# Patient Record
Sex: Male | Born: 1950 | Race: Black or African American | Hispanic: No | Smoking: Never smoker
Health system: Southern US, Community
[De-identification: ages and names within clinical notes are randomized; demographics above are authoritative.]

## PROBLEM LIST (undated history)

## (undated) DIAGNOSIS — F1110 Opioid abuse, uncomplicated: Secondary | ICD-10-CM

## (undated) DIAGNOSIS — B1920 Unspecified viral hepatitis C without hepatic coma: Secondary | ICD-10-CM

## (undated) DIAGNOSIS — Z8619 Personal history of other infectious and parasitic diseases: Secondary | ICD-10-CM

## (undated) HISTORY — DX: Personal history of other infectious and parasitic diseases: Z86.19

## (undated) HISTORY — DX: Unspecified viral hepatitis C without hepatic coma: B19.20

## (undated) HISTORY — PX: TONSILLECTOMY: SUR1361

---

## 1988-11-08 DIAGNOSIS — B1920 Unspecified viral hepatitis C without hepatic coma: Secondary | ICD-10-CM

## 1988-11-08 HISTORY — DX: Unspecified viral hepatitis C without hepatic coma: B19.20

## 2000-05-26 ENCOUNTER — Encounter (INDEPENDENT_AMBULATORY_CARE_PROVIDER_SITE_OTHER): Payer: Self-pay

## 2000-05-26 ENCOUNTER — Ambulatory Visit (HOSPITAL_COMMUNITY): Admission: RE | Admit: 2000-05-26 | Discharge: 2000-05-26 | Payer: Self-pay | Admitting: Gastroenterology

## 2001-11-08 HISTORY — PX: COLONOSCOPY: SHX174

## 2002-08-06 ENCOUNTER — Encounter: Payer: Self-pay | Admitting: Internal Medicine

## 2002-08-06 ENCOUNTER — Ambulatory Visit (HOSPITAL_COMMUNITY): Admission: RE | Admit: 2002-08-06 | Discharge: 2002-08-06 | Payer: Self-pay | Admitting: Internal Medicine

## 2004-11-08 HISTORY — PX: PERCUTANEOUS LIVER BIOPSY: SUR136

## 2004-12-23 ENCOUNTER — Ambulatory Visit: Payer: Self-pay | Admitting: Gastroenterology

## 2005-01-18 ENCOUNTER — Ambulatory Visit: Payer: Self-pay | Admitting: Gastroenterology

## 2005-09-06 ENCOUNTER — Ambulatory Visit: Payer: Self-pay | Admitting: Internal Medicine

## 2005-09-08 ENCOUNTER — Ambulatory Visit: Payer: Self-pay | Admitting: Internal Medicine

## 2009-02-03 ENCOUNTER — Encounter: Payer: Self-pay | Admitting: Internal Medicine

## 2009-03-10 ENCOUNTER — Encounter: Payer: Self-pay | Admitting: Internal Medicine

## 2009-04-15 ENCOUNTER — Ambulatory Visit: Payer: Self-pay | Admitting: Internal Medicine

## 2009-04-15 DIAGNOSIS — Z8619 Personal history of other infectious and parasitic diseases: Secondary | ICD-10-CM

## 2009-04-15 DIAGNOSIS — L0390 Cellulitis, unspecified: Secondary | ICD-10-CM

## 2009-04-15 DIAGNOSIS — L0291 Cutaneous abscess, unspecified: Secondary | ICD-10-CM

## 2009-04-15 HISTORY — DX: Personal history of other infectious and parasitic diseases: Z86.19

## 2009-04-16 ENCOUNTER — Encounter: Payer: Self-pay | Admitting: Internal Medicine

## 2009-04-17 ENCOUNTER — Encounter: Payer: Self-pay | Admitting: Internal Medicine

## 2009-04-17 ENCOUNTER — Inpatient Hospital Stay (HOSPITAL_COMMUNITY): Admission: EM | Admit: 2009-04-17 | Discharge: 2009-04-19 | Payer: Self-pay | Admitting: Emergency Medicine

## 2009-04-18 ENCOUNTER — Encounter (INDEPENDENT_AMBULATORY_CARE_PROVIDER_SITE_OTHER): Payer: Self-pay | Admitting: General Surgery

## 2009-04-23 ENCOUNTER — Telehealth (INDEPENDENT_AMBULATORY_CARE_PROVIDER_SITE_OTHER): Payer: Self-pay | Admitting: *Deleted

## 2011-02-15 LAB — BASIC METABOLIC PANEL
BUN: 19 mg/dL (ref 6–23)
CO2: 26 mEq/L (ref 19–32)
Calcium: 9.8 mg/dL (ref 8.4–10.5)
Chloride: 106 mEq/L (ref 96–112)
Creatinine, Ser: 1.15 mg/dL (ref 0.4–1.5)
GFR calc Af Amer: >60 mL/min (ref >60)
GFR calc non Af Amer: >60 mL/min (ref >60)
Glucose, Bld: 86 mg/dL (ref 70–99)
Potassium: 4.3 mEq/L (ref 3.5–5.1)
Sodium: 137 mEq/L (ref 135–145)

## 2011-02-15 LAB — TISSUE CULTURE

## 2011-02-15 LAB — DIFFERENTIAL
Basophils Absolute: 0 10*3/uL (ref 0.0–0.1)
Basophils Relative: 0 % (ref 0–1)
Eosinophils Absolute: 0.2 10*3/uL (ref 0.0–0.7)
Eosinophils Relative: 3 % (ref 0–5)
Lymphocytes Relative: 22 % (ref 12–46)
Lymphs Abs: 1.2 10*3/uL (ref 0.7–4.0)
Monocytes Absolute: 1 10*3/uL (ref 0.1–1.0)
Monocytes Relative: 17 % — ABNORMAL HIGH (ref 3–12)
Neutro Abs: 3.3 10*3/uL (ref 1.7–7.7)
Neutrophils Relative %: 58 % (ref 43–77)

## 2011-02-15 LAB — CBC
HCT: 41 % (ref 39.0–52.0)
Hemoglobin: 13.8 g/dL (ref 13.0–17.0)
MCHC: 33.7 g/dL (ref 30.0–36.0)
MCV: 87.5 fL (ref 78.0–100.0)
Platelets: 333 10*3/uL (ref 150–400)
RBC: 4.68 MIL/uL (ref 4.22–5.81)
RDW: 13.8 % (ref 11.5–15.5)
WBC: 5.7 10*3/uL (ref 4.0–10.5)

## 2011-02-15 LAB — ANAEROBIC CULTURE

## 2011-03-04 ENCOUNTER — Encounter: Payer: Self-pay | Admitting: Internal Medicine

## 2011-03-08 ENCOUNTER — Ambulatory Visit: Payer: Self-pay | Admitting: Internal Medicine

## 2011-03-08 DIAGNOSIS — Z0289 Encounter for other administrative examinations: Secondary | ICD-10-CM

## 2011-03-19 ENCOUNTER — Emergency Department (HOSPITAL_BASED_OUTPATIENT_CLINIC_OR_DEPARTMENT_OTHER)
Admission: EM | Admit: 2011-03-19 | Discharge: 2011-03-19 | Disposition: A | Discharge status: Home / Self Care | Payer: Self-pay | Attending: Emergency Medicine | Admitting: Emergency Medicine

## 2011-03-19 ENCOUNTER — Emergency Department (INDEPENDENT_AMBULATORY_CARE_PROVIDER_SITE_OTHER): Payer: Self-pay

## 2011-03-19 DIAGNOSIS — M25469 Effusion, unspecified knee: Secondary | ICD-10-CM | POA: Insufficient documentation

## 2011-03-19 DIAGNOSIS — M25569 Pain in unspecified knee: Secondary | ICD-10-CM

## 2011-03-19 LAB — SYNOVIAL CELL COUNT + DIFF, W/ CRYSTALS
Crystals, Fluid: NONE SEEN
Eosinophils-Synovial: 0 % (ref 0–1)
Lymphocytes-Synovial Fld: 26 % — ABNORMAL HIGH (ref 0–20)
Monocyte-Macrophage-Synovial Fluid: 47 % — ABNORMAL LOW (ref 50–90)
Neutrophil, Synovial: 27 % — ABNORMAL HIGH (ref 0–25)
Other Cells-SYN: 0
WBC, Synovial: 44 /mm3 (ref 0–200)

## 2011-03-19 LAB — GRAM STAIN

## 2011-03-22 LAB — PATHOLOGIST SMEAR REVIEW

## 2011-03-23 LAB — BODY FLUID CULTURE: Culture: NO GROWTH

## 2011-03-23 NOTE — Op Note (Signed)
NAME:  [name], [name]              ACCOUNT NO.:  [id number]   MEDICAL RECORD NO.:  [id number]          PATIENT TYPE:  INP   LOCATION:  5127                         FACILITY:  [facility]   PHYSICIAN:  Almond Lint, MD       DATE OF BIRTH:  05-25-1951   DATE OF PROCEDURE:  DATE OF DISCHARGE:                               OPERATIVE REPORT   PREOPERATIVE DIAGNOSIS:  Bilateral chronic buttock abscess.   POSTOPERATIVE DIAGNOSIS:  Bilateral chronic buttock abscess.   PROCEDURE PERFORMED:  Irrigation and debridement of bilateral chronic  buttock abscesses.   SURGEON:  Almond Lint, MD   ANESTHESIA:  General and local.   FINDINGS:  Chronic nonhealing wounds with minimal pus.  No fistulous  tract found to the rectum.  No anorectal masses palpated or seen on  anoscopy.   SPECIMEN:  Debrided tissue and cultures sent to micro and pathology.   ESTIMATED BLOOD LOSS:  Minimal.   COMPLICATIONS:  None known.   PROCEDURE:  [name] is a 60 year old male who was identified in the  holding area and taken to the operating room where he was intubated on  the stretcher and flipped to the prone position.  His perianal region  was prepped and draped in a sterile fashion.  The digital exam was  performed of the anus with no palpable masses or chronic sinus tracts  palpable.  The anoscope was then advanced into the anus and there was no  inflammation or sequelae of either hemorrhoid or fistulas, or fissures.  Attention was then directed to the right wound.  This was larger of the  2 wounds and the chronic abscess cavity was cleaned out back to bleeding  tissue with the scalpel and curved Mayo scissors.  Cultures were taken  of this for anaerobes and aerobes.  Also portion was sent for pathology.  The area was copiously irrigated.  Attention was then directed to the  left side.  This had not quite come to the head yet, but does appear to  be slightly fluctuant.  This was incised and a small amount of pus  was  obtained.  This was cultured.  The indurated area was then taken out  with combination of this scalpel and curve Mayo.  This was also debrided  back to healthy bleeding tissue.  This area was made hemostatic with the  Bovie.  They were then packed with 1-inch half-inch iodoform gauze.  The  patient was awakened and then taken to PACU in stable condition.      Almond Lint, MD  Electronically Signed    FB/MEDQ  D:  04/18/2009  T:  04/19/2009  Job:  [id number]

## 2011-03-23 NOTE — H&P (Signed)
NAME:  RENDERElder, Noah              ACCOUNT NO.:  [id number]   MEDICAL RECORD NO.:  [id number]          PATIENT TYPE:  INP   LOCATION:  5127                         FACILITY:  [facility]   PHYSICIAN:  Lennie Muckle, MD      DATE OF BIRTH:  [birth year]   DATE OF ADMISSION:  04/17/2009  DATE OF DISCHARGE:                              HISTORY & PHYSICAL   Office chart number is [phone].   Noah [name] is a 60 year old male who has approximately a 2-year history  on and off abscesses on his buttock area.  He states that last week it  became increase in drainage and painful.  He was seen by Dr. Marga Melnick who debrided the wound and placed him on Keflex.  Due to the  increasing drainage, he was sent to our Surgery Clinic for followup.  He  states he has no fevers or chills but is bothered by the drainage.  He  has hepatitis B.  He states he has been tested for HIV, but he has not  had the results of these.  He has had no illnesses that he is aware of.  He does have a small wound on his left anterior shin.   PAST MEDICAL HISTORY:  Significant for hepatitis B.   PAST SURGICAL HISTORY:  Liver biopsy.   MEDICATIONS:  TMP, sulfamethizole 800/160 for antibiotics.   FAMILY HISTORY:  His father had cirrhosis, mother had diabetes.   SOCIAL HISTORY:  He has been self-employed.  He is a Media planner, has 2  children, and is divorced.  He does not smoke.  He does drink every  weekend approximately a pint.   REVIEW OF SYSTEMS:  He has hepatitis B, otherwise is negative.   PHYSICAL EXAMINATION:  GENERAL:  He is a well-developed, well-nourished  male, in no acute distress.  VITAL SIGNS:  Blood pressure 131/73, pulse 78, temperature 97.8, weight  170.  HEENT:  Head is normocephalic.  Wears corrective lenses.  Sclerae are  clear.  NECK:  Supple.  No lymphadenopathy.  CHEST:  Clear to auscultation bilaterally.  CARDIOVASCULAR:  Regular rate and rhythm.  ABDOMEN:  Soft, nontender, nondistended.  SKIN:  There is a small wound to the left anterior shin.  His buttock  area, there is approximately a 3-cm wound on the right buttock cheek,  which is approximately 2-3 cm deep.  There is some exudative material.  Chronic inflammatory reaction on the left buttock cheek.  There is a  small 1-cm papule-like area on the left side of cheek.  This is not  draining.  There does not appear to be any connection with the rectum.   ASSESSMENT AND PLAN:  Acute on chronic abscess cavity in the buttock  area.  I think this needs debridement in the operating room, more  extensive than what it can be done in the office.  I have contacted Dr.  Donell Beers for  debridement of the wound tomorrow.  We will get preoperative testing on  him with a CBC, serum chemistries, place him on IV antibiotic tonight of  Invanz and debridement  tomorrow and teach him wound care and then  hopefully home in the next day or so.  All questions were answered and  now we will go ahead and send him to [facility] today.      Lennie Muckle, MD  Electronically Signed     ALA/MEDQ  D:  04/17/2009  T:  04/18/2009  Job:  [id number]   cc:   Titus Dubin. Alwyn Ren, MD,FACP,FCCP

## 2011-03-26 NOTE — Procedures (Signed)
[facility]  Patient:    RENDERSelig, [place]                       MRN: [id number] Proc. Date: 05/26/00 Adm. Date:  [id number] Disc. Date: [id number] Attending:  Judeth Cornfield                           Procedure Report  PROCEDURE:  Percutaneous liver biopsy.  GASTROENTEROLOGIST:  Barbette Hair. Arlyce Dice, M.D. [facility]  HISTORY:  The patient has hepatitis C and abnormal liver tests.  INFORMED CONSENT:  The patient provided consent after risks, benefits, and alternatives were explained.  MEDICATIONS:  Versed 2.5 mg, fentanyl 37.5 mcg IV.  DESCRIPTION OF PROCEDURE:  The liver was percussed.  In the supine position, the skin was prepped with Betadine and injected with 1% lidocaine. Percutaneous biopsy was made via one pass utilizing a 14-gauge tissue biopsy needle.  A 1 cm liver core was obtained.  The patient tolerated the procedure well. DD:  08/09/00 TD:  08/09/00 Job: [id number]

## 2011-03-26 NOTE — Discharge Summary (Signed)
NAME:  [name], Noah [name]              ACCOUNT NO.:  [id number]   MEDICAL RECORD NO.:  [id number]          PATIENT TYPE:  INP   LOCATION:  5127                         FACILITY:  [facility]   PHYSICIAN:  Currie Paris, M.D.DATE OF BIRTH:  [birth year]   DATE OF ADMISSION:  04/17/2009  DATE OF DISCHARGE:  04/19/2009                               DISCHARGE SUMMARY   ADMITTING PHYSICIAN:  Amber L. Freida Busman, MD   OPERATIVE PHYSICIAN:  Almond Lint, MD   CHIEF COMPLAINT/REASON FOR ADMISSION:  [name] is a 60 year old male  patient who has on and off again history of buttock abscesses over the  past 2 years.  Over the last week, it has increased in drainage and  become more painful.  He was seen by Dr. Alwyn Ren, primary care physician  who placed him on Keflex.  Due to increasing drainage, he was sent to  the Urgent Clinic at [facility] for possible drainage of  this area.  He has no fevers or chills and was concerned about the  drainage.  He has known hepatitis B and has been tested recently for HIV  that does not have the results of this back.  On exam, he was found to  have a small wound on the left anterior shin.  In the buttock area,  there was a 3-cm wound of right buttock cheek up to 3 cm deep with  exudate material, chronic inflammatory reaction at the buttock, cheek,  and a tiny 1-cm papula carrying the left side of the cheek, not  draining.  There does not appear to be any connection with the rectum  based on clinical exam.  The patient was admitted with the following  diagnoses; acute on chronic abscess cavity in the buttock area requiring  intraoperative debris.   HOSPITAL COURSE:  The patient was admitted by Dr. Freida Busman where he was  subsequently taken to the OR by Dr. Donell Beers where he underwent I and D of  the right buttock wound.  He was also had the area on the left buttock  drained as well.  He have been placed on empiric antibiotics therapy  with Pincus Sanes and the  surgical date was April 18, 2009.   By postop day #1, April 19, 2009, the patient was evaluated by Dr. Jamey Ripa  who felt that the patient was ready for discharge home.  Wounds were  stable.  He will be having [facility] following  and assisting with b.i.d. wound care.   DISCHARGE DIAGNOSIS:  Acute on chronic bilateral buttock abscesses,  status post intraoperative incision and drainage.   DISCHARGE MEDICATIONS:  1. Sulfamethazine - TMP 800/160 b.i.d., continue previous      prescription.  2. Vicodin one to two tablets every 4 hours as needed for pain.   OTHER INSTRUCTIONS:  No restrictions in diet.  No restrictions in  activity.  Recommend not driving while taking Vicodin.  Wound care per  home health RN and normal saline packing b.i.d.  Shower and sitz baths  one to four times daily.   FOLLOWUP APPOINTMENTS:  He is  to call Dr. Arita Miss to be seen in 10-14  days.      Allison L. Rennis Harding, N.P.      Currie Paris, M.D.  Electronically Signed    ALE/MEDQ  D:  05/06/2009  T:  05/07/2009  Job:  [id number]   cc:   Almond Lint, MD

## 2011-05-14 ENCOUNTER — Encounter: Payer: Self-pay | Admitting: Internal Medicine

## 2011-05-17 ENCOUNTER — Encounter: Payer: Self-pay | Admitting: Internal Medicine

## 2011-05-17 ENCOUNTER — Ambulatory Visit (INDEPENDENT_AMBULATORY_CARE_PROVIDER_SITE_OTHER): Payer: Self-pay | Admitting: Internal Medicine

## 2011-05-17 VITALS — BP 126/80 | HR 64 | Temp 98.2°F | Resp 14 | Ht 68.75 in | Wt 179.8 lb

## 2011-05-17 DIAGNOSIS — K219 Gastro-esophageal reflux disease without esophagitis: Secondary | ICD-10-CM

## 2011-05-17 DIAGNOSIS — Z0000 Encounter for general adult medical examination without abnormal findings: Secondary | ICD-10-CM

## 2011-05-17 DIAGNOSIS — M25461 Effusion, right knee: Secondary | ICD-10-CM

## 2011-05-17 DIAGNOSIS — M25469 Effusion, unspecified knee: Secondary | ICD-10-CM

## 2011-05-17 DIAGNOSIS — Z23 Encounter for immunization: Secondary | ICD-10-CM

## 2011-05-17 DIAGNOSIS — B1710 Acute hepatitis C without hepatic coma: Secondary | ICD-10-CM

## 2011-05-17 DIAGNOSIS — R9431 Abnormal electrocardiogram [ECG] [EKG]: Secondary | ICD-10-CM

## 2011-05-17 LAB — POCT URINALYSIS DIPSTICK
Bilirubin, UA: NEGATIVE
Glucose, UA: NEGATIVE
Ketones, UA: NEGATIVE
Spec Grav, UA: 1.015

## 2011-05-17 MED ORDER — TRAMADOL HCL 50 MG PO TABS: 50.0000 mg | ORAL_TABLET | Freq: Four times a day (QID) | ORAL | Status: DC | PRN

## 2011-05-17 MED ORDER — TETANUS-DIPHTH-ACELL PERTUSSIS 5-2.5-18.5 LF-MCG/0.5 IM SUSP
0.5000 mL | Freq: Once | INTRAMUSCULAR | Status: AC
  Administered 2011-05-17: 0.5 mL via INTRAMUSCULAR

## 2011-05-17 MED ORDER — RANITIDINE HCL 150 MG PO TABS: 150.0000 mg | ORAL_TABLET | Freq: Two times a day (BID) | ORAL | Status: DC

## 2011-05-17 NOTE — Patient Instructions (Signed)
Please consider a cardiology referral to assess the nonspecific T wave changes on EKG which we discussed. The triggers for dyspepsia or "heart burn"  include stress; the "aspirin family" ; alcohol; peppermint; and caffeine (coffee, tea, cola, and chocolate). The aspirin family would include aspirin and the nonsteroidal agents such as ibuprofen &  Naproxen. Tylenol would not cause reflux. If having dyspepsia ; food & dink should be avoided for @ least 2 hors before going to bed.

## 2011-05-17 NOTE — Progress Notes (Signed)
Subjective:    Patient ID: Noah [name], male    DOB: 07/07/1951, 60 y.o.   MRN: [id number]  HPI Mr. Zane Herald is here for a physical as recommended by the  [facility] at [facility]  60. He had had  pain and swelling in the right knee for over a month which he treated with vinegar ingestion w/o response He denied an injury or trauma.  On 5/11 the knee was tapped. The films had  revealed the effusion with no bony changes.  Gram stain revealed rare white cells with  PMNs and mononuclear cells. There were no organisms seen. Microscopic analysis revealed no crystals.  The fluid was returned the day after it was drained. He continues to have pain and swelling in the knee.  He was to be seen by  The Sports Medicine specialist, Dr. Pearletha Forge after a comprehensive evaluation.    Review of Systems Patient reports no vision changes,anorexia, weight change, fever ,adenopathy, persistant / recurrent hoarseness, swallowing issues, chest pain,palpitations, edema,persistant / recurrent cough, hemoptysis, dyspnea(rest, exertional, paroxysmal nocturnal), gastrointestinal  bleeding (melena, rectal bleeding), abdominal pain,  GU symptoms( dysuria, hematuria, pyuria, voiding/incontinence  issues) syncope, focal weakness, memory loss,numbness & tingling, skin/hair/nail changes,depression, anxiety, or abnormal bruising/bleeding. He describes intermittent substernal burning upon awakening. Has also had dyspepsia without dysphagia. 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 This was evaluated with an MRI was normal.    Objective:   Physical Exam Gen.: Healthy and well-nourished in appearance. Alert, appropriate and cooperative throughout exam. Head: Normocephalic without obvious abnormalities;  no alopecia . Moustache Eyes: No corneal or conjunctival inflammation noted. Pupils equal round reactive to light and accommodation. Fundal exam is benign without hemorrhages, exudate,  papilledema. Extraocular motion intact. Vision grossly normal with lenses. Arcus senilis . Ears: External  ear exam reveals no significant lesions or deformities. Wax R canal . Hearing is grossly reduced on R Nose: External nasal exam reveals no deformity or inflammation. Nasal mucosa are pink and moist. No lesions or exudates noted.  Mouth: Oral mucosa and oropharynx reveal no lesions or exudates. Teeth in good repair. Neck: No deformities, masses, or tenderness noted. Range of motion &. Thyroid normal. Lungs: Normal respiratory effort; chest expands symmetrically. Lungs are clear to auscultation without rales, wheezes, or increased work of breathing. Heart: Normal rate and rhythm. Normal S1 and S2. No gallop, click, or rub. Grade 1/2 - 1 systolic murmur . Abdomen: Bowel sounds normal; abdomen soft and nontender. No masses, organomegaly or hernias noted. Genitalia/ DRE : BPH w/o nodules   .                                                                                   Musculoskeletal/extremities: No deformity or scoliosis noted of  the thoracic or lumbar spine. No clubbing, cyanosis, edema, or deformity noted. Range of motion  normal .Tone & strength  normal.Joints normal. Nail health  Good. Ecchymosis 4th  L fingernail. Effusion R knee Vascular: Carotid, radial artery, dorsalis pedis and  posterior tibial pulses are full and equal. No bruits present. Neurologic: Alert and oriented x3. Deep tendon reflexes symmetrical and  normal.          Skin: Intact without suspicious lesions or rashes. 2X 1.5 keratosis R thigh. Lymph: No cervical, axillary, or inguinal lymphadenopathy present. Psych: Mood and affect are normal. Normally interactive                                                                                         Assessment & Plan:  #1 comprehensive physical exam;acute issue is R knee effusion #2 see Problem List with Assessments & Recommendations  #3 nonspecific EKG  abnormalities; possible slight progression in the lateral V. leads since 2006.  #4 esophageal reflux  Plan: He will be referred to Dr. Pearletha Forge to address the knee or effusion. Pain medications will be provided pending that appointment.  Because of the nonspecific EKG changes and the chest discomfort, further cardiac evaluation would be recommended. Ranitidine will be prescribed for reflux which is most likely Plan: see Orders

## 2011-05-17 NOTE — Progress Notes (Signed)
Addended by: Edgardo Roys on: 05/17/2011 03:49 PM   Modules accepted: Orders

## 2011-05-18 ENCOUNTER — Telehealth: Payer: Self-pay

## 2011-05-18 LAB — CBC WITH DIFFERENTIAL/PLATELET
Basophils Absolute: 0 10*3/uL (ref 0.0–0.1)
Eosinophils Relative: 1.5 % (ref 0.0–5.0)
Hemoglobin: 14.2 g/dL (ref 13.0–17.0)
Lymphocytes Relative: 29.4 % (ref 12.0–46.0)
Monocytes Relative: 11.6 % (ref 3.0–12.0)
Platelets: 275 10*3/uL (ref 150.0–400.0)
RDW: 14 % (ref 11.5–14.6)
WBC: 5.9 10*3/uL (ref 4.5–10.5)

## 2011-05-18 LAB — BASIC METABOLIC PANEL
BUN: 13 mg/dL (ref 6–23)
GFR: 93.54 mL/min (ref >60.00)
Potassium: 4.6 mEq/L (ref 3.5–5.1)
Sodium: 140 mEq/L (ref 135–145)

## 2011-05-18 LAB — LIPID PANEL
Cholesterol: 170 mg/dL (ref 0–200)
LDL Cholesterol: 94 mg/dL (ref 0–99)
Total CHOL/HDL Ratio: 3

## 2011-05-18 LAB — HEPATIC FUNCTION PANEL
ALT: 14 U/L (ref 0–53)
AST: 23 U/L (ref 0–37)
Alkaline Phosphatase: 42 U/L (ref 39–117)
Total Bilirubin: 1.4 mg/dL — ABNORMAL HIGH (ref 0.3–1.2)

## 2011-05-18 NOTE — Telephone Encounter (Signed)
Left msg on voicemail.

## 2011-05-18 NOTE — Telephone Encounter (Signed)
Patient called to ask why Dr.Hopper gave him rx for Tramadol vs Vicodin. Patient said he asked for rx for Vicodin because that's what he had in the past and it worked great.  Dr.Hopper please advise

## 2011-05-18 NOTE — Telephone Encounter (Signed)
I called patient back with Dr.Hopper's response and he re-stated that he is most comfortable with Vicodin and he will be the one taking the med and fells that he should get what he is most comfortable with and he would like to talk ONLY with Dr.Hopper. Patient requested that Dr.Hopper call him back

## 2011-05-18 NOTE — Telephone Encounter (Signed)
Give him 30 Vicodin, 1 every 6 hrs prn  & get him into Dr Pearletha Forge @ [address]  ASAP

## 2011-05-18 NOTE — Telephone Encounter (Signed)
The tramadol is in the same family and does not have as severe adverse effects as the Vicodin does. A major issue with Vicodin is  constipation which can be debilitating. The tramadol as much easier on the GI tract.

## 2011-05-19 MED ORDER — HYDROCODONE-ACETAMINOPHEN 5-500 MG PO TABS: 1.0000 | ORAL_TABLET | Freq: Four times a day (QID) | ORAL | Status: DC | PRN

## 2011-05-19 NOTE — Telephone Encounter (Signed)
Spoke w/ pt aware of information.  

## 2011-05-24 ENCOUNTER — Ambulatory Visit (INDEPENDENT_AMBULATORY_CARE_PROVIDER_SITE_OTHER): Payer: Self-pay | Admitting: Family Medicine

## 2011-05-24 VITALS — BP 120/76 | HR 69 | Ht 69.5 in | Wt 170.2 lb

## 2011-05-24 DIAGNOSIS — M25569 Pain in unspecified knee: Secondary | ICD-10-CM

## 2011-05-24 DIAGNOSIS — M25561 Pain in right knee: Secondary | ICD-10-CM

## 2011-05-24 NOTE — Patient Instructions (Signed)
Your pain is either from arthritis or a degenerative meniscal tear of your knee. These are both treated similarly to start with. Take tylenol 500mg  1-2 tabs three times a day for pain. Aleve 1-2 tabs twice a day with food Glucosamine sulfate 750mg  twice a day is a supplement that has been shown to help arthritis. Capsaicin topically up to four times a day may also help with pain. Cortisone injections are an option. It's important that you continue to stay active. Consider physical therapy to strengthen muscles around the joint that hurts to take pressure off of the joint itself. Heat or ice 15 minutes at a time 3-4 times a day as needed to help with pain. Water aerobics and cycling with low resistance are the best two types of exercise for arthritis. Follow up with me in 6 weeks for a recheck - if you are not improving as I expect, we can consider MRI to further assess for a meniscal tear.

## 2011-05-25 ENCOUNTER — Encounter: Payer: Self-pay | Admitting: Family Medicine

## 2011-05-25 DIAGNOSIS — M1711 Unilateral primary osteoarthritis, right knee: Secondary | ICD-10-CM | POA: Insufficient documentation

## 2011-05-25 NOTE — Progress Notes (Signed)
Subjective:    Patient ID: Noah [name], male    DOB: 1951-05-18, 60 y.o.   MRN: [id number]  PCP: Marga Melnick MD  HPI 60 yo M here for right knee swelling.  Patient reports for the past 3 months he has had slowly worsening right knee swelling and anterior pain. No known injury and no remote injury. Just woke up and knee was swollen one morning. Has tried hydrocodone, ibuprofen, and knee sleeve. Feels unstable but has not given out. No true catching or locking. Has not tried ice or heat. On 5/11 went to ED and had effusion aspirated - no crystals, only 44 WBCs, no growth on culture. X-rays showed mild DJD. No prior knee surgeries.  Past Medical History  Diagnosis Date  . Hepatitis C 1990    [facility]    Current Outpatient Prescriptions on File Prior to Visit  Medication Sig Dispense Refill  . HYDROcodone-acetaminophen (VICODIN) 5-500 MG per tablet Take 1 tablet by mouth every 6 (six) hours as needed. 1-2 by mouth every 6 hours as needed  30 tablet  0  . mupirocin (BACTROBAN) 2 % cream Apply 1 application topically 2 (two) times daily.        . ranitidine (ZANTAC) 150 MG tablet Take 1 tablet (150 mg total) by mouth 2 (two) times daily.  60 tablet  1  . traMADol (ULTRAM) 50 MG tablet Take 1 tablet (50 mg total) by mouth every 6 (six) hours as needed for pain.  2300 tablet  0    Past Surgical History  Procedure Date  . Colonoscopy 2003  . Percutaneous liver biopsy 2006    Dr Arlyce Dice  . Tonsillectomy     No Known Allergies  History   Social History  . Marital Status: Divorced    Spouse Name: N/A    Number of Children: N/A  . Years of Education: N/A   Occupational History  . Not on file.   Social History Main Topics  . Smoking status: Never Smoker   . Smokeless tobacco: Not on file  . Alcohol Use: Yes  . Drug Use: No  . Sexually Active: Not on file   Other Topics Concern  . Not on file   Social History Narrative  . No narrative on file    Family History   Problem Relation Age of Onset  . Peripheral vascular disease Mother   . Alcohol abuse Father     BP 120/76  Pulse 69  Ht 5' 9.5" (1.765 m)  Wt 170 lb 3.2 oz (77.202 kg)  BMI 24.77 kg/m2  Review of Systems See HPI above.    Objective:   Physical Exam Gen: NAD  R Knee: Mild effusion.  No gross deformity, ecchymoses, warmth. Mild-mod medial joint line TTP.  No lateral joint line, post patellar, pes, or other TTP about knee. FROM. Negative ant/post drawers. Negative valgus/varus testing. Negative lachmanns. Negative mcmurrays, apleys, patellar apprehension, clarkes. NV intact distally.    L knee: FROM without pain, swelling, weakness, or instability.  Assessment & Plan:  1. R knee pain/effusion - based on exam, x-rays, history, prior aspiration results, believe his pain and swelling is most likely related to DJD or possible degenerative medial meniscal tear.  Will start PT.  Given intraarticular cortisone injection today.  Icing, tylenol or nsaids (discussed that he should run tylenol by his hepatitis physician before doing this).  F/u in 6 weeks for reevaluation.  If not improved will consider MRI with possible arthroscopy if shown  to have a meniscal tear.  After informed written consent, patient was seated on exam table. Right knee was prepped with alcohol swab and utilizing anterolateral approach, patient's right knee was injected intraarticularly with 3:1 marcaine: depomedrol. Patient tolerated the procedure well without immediate complications.

## 2011-05-25 NOTE — Assessment & Plan Note (Addendum)
based on exam, x-rays, history, prior aspiration results, believe his pain and swelling is most likely related to DJD or possible degenerative medial meniscal tear.  Will start PT.  Given intraarticular cortisone injection today.  Icing, tylenol or nsaids (discussed that he should run tylenol by his hepatitis physician before doing this).  F/u in 6 weeks for reevaluation.  If not improved will consider MRI with possible arthroscopy if shown to have a meniscal tear.  After informed written consent, patient was seated on exam table. Right knee was prepped with alcohol swab and utilizing anterolateral approach, patient's right knee was injected intraarticularly with 3:1 marcaine: depomedrol. Patient tolerated the procedure well without immediate complications.

## 2011-05-26 ENCOUNTER — Encounter: Payer: Self-pay | Admitting: Internal Medicine

## 2011-05-31 ENCOUNTER — Other Ambulatory Visit: Payer: Self-pay | Admitting: *Deleted

## 2011-06-01 NOTE — Telephone Encounter (Signed)
Filled on 05/19/11- #30, Dr.Hopper please advise

## 2011-06-01 NOTE — Telephone Encounter (Signed)
Any additional pain medications for the knee should come from Dr. Pearletha Forge, the  Specialist he is seeing

## 2011-06-02 ENCOUNTER — Telehealth: Payer: Self-pay | Admitting: Internal Medicine

## 2011-06-02 NOTE — Telephone Encounter (Signed)
I spoke with patient earlier and informed him of Dr.Hopper's response and he indicated he will contact the specialist

## 2011-06-09 ENCOUNTER — Other Ambulatory Visit: Payer: Self-pay | Admitting: *Deleted

## 2011-06-09 ENCOUNTER — Telehealth: Payer: Self-pay | Admitting: Family Medicine

## 2011-06-09 DIAGNOSIS — M25569 Pain in unspecified knee: Secondary | ICD-10-CM

## 2011-06-09 NOTE — Telephone Encounter (Signed)
I sent phone note to Dr Pearletha Forge requesting he address the knee pain issue. If he does not feel narcotic is indicated ;only other option is Orthopedic assessment

## 2011-06-09 NOTE — Telephone Encounter (Signed)
Pt saw Sports Med Specialist-- Dr.Hudnall, he recommended therapist only, he is getting Therapy done at the [facility] (due to insurance). He got a cortisone shot. He states the pain is to bad to do therapy at this point. Sports Med doc will not fill his pain medication. Please advise if he can have a refill on his Hydrocodone .   [facility].

## 2011-06-09 NOTE — Telephone Encounter (Signed)
The specialist should be seen if this pain is persisting or progressing, especially if he can't do PT

## 2011-06-09 NOTE — Telephone Encounter (Signed)
Patient has called our office requesting pain medicine past few business days for his knee pain.  Messages received and were answered by myself and our staff.  Patient was given cortisone injection at last office visit 2 weeks ago for arthritis vs degenerative meniscal tear.  Has had knee aspirated before and this was negative (no gout, infection, etc) consistent with these diagnoses.  He does not have insurance so was given information to contact [employer] to get 100% [employer] coverage so he could start physical therapy and was to call me back when this went through so we could put in the order for this.  This coverage will also be helpful should we proceed with an MRI to further assess.  Just spoke with patient and he has not started this process (though reports he is doing some therapy at the [facility]) - was given [employer] number again.  He can try a different anti-inflammatory along with tylenol and his therapy.  We can repeat cortisone injections every 3 months if needed (though as this one has not helped him per his report, would move forward with MRI next).  We told him each phone call that we do not fill/refill narcotic pain medicine for arthritis/degenerative meniscal tear of the knee.

## 2011-06-09 NOTE — Telephone Encounter (Signed)
Message left for patient to return my call.  

## 2011-06-09 NOTE — Telephone Encounter (Signed)
Called pt, advised him of Dr.Hoppers advice. Pt states that the specialist is telling him he has to see Dr.Hopper to get any medication. Advised pt that he could see Dr.Hopper but that Dr.Hopper referred him to a specialist so that they could treat him and there would be no guarantee that Dr.Hopper would fill this medication for him today. Advised pt to call Sports med and tell them that his pain is getting worse and his unable to do therapy due to pain. Pt would not say one way or another what he was going to do. Did not make an appt here either for today.

## 2011-06-10 NOTE — Telephone Encounter (Signed)
Pt is aware. He would like ortho referral.

## 2011-06-10 NOTE — Telephone Encounter (Signed)
Message left for patient to return my call.  

## 2011-06-18 ENCOUNTER — Other Ambulatory Visit: Payer: Self-pay | Admitting: Internal Medicine

## 2011-06-18 NOTE — Telephone Encounter (Signed)
Dr.Hopper please further advise on refill request

## 2011-06-18 NOTE — Telephone Encounter (Signed)
Left detailed msg on pharmacy voicemail rx denied.

## 2011-06-18 NOTE — Telephone Encounter (Signed)
This medication must come from the sports medicine specialist who is treating his knee. If he  continuse to have pain he should see that specialist again or  I can refer him to an orthopedist.

## 2011-07-04 ENCOUNTER — Emergency Department (HOSPITAL_BASED_OUTPATIENT_CLINIC_OR_DEPARTMENT_OTHER)
Admission: EM | Admit: 2011-07-04 | Discharge: 2011-07-04 | Disposition: A | Discharge status: Home / Self Care | Payer: Self-pay | Attending: Emergency Medicine | Admitting: Emergency Medicine

## 2011-07-04 ENCOUNTER — Encounter (HOSPITAL_BASED_OUTPATIENT_CLINIC_OR_DEPARTMENT_OTHER): Payer: Self-pay | Admitting: *Deleted

## 2011-07-04 ENCOUNTER — Emergency Department (INDEPENDENT_AMBULATORY_CARE_PROVIDER_SITE_OTHER): Payer: Self-pay

## 2011-07-04 DIAGNOSIS — M79609 Pain in unspecified limb: Secondary | ICD-10-CM

## 2011-07-04 DIAGNOSIS — R609 Edema, unspecified: Secondary | ICD-10-CM

## 2011-07-04 DIAGNOSIS — M25449 Effusion, unspecified hand: Secondary | ICD-10-CM

## 2011-07-04 DIAGNOSIS — M7989 Other specified soft tissue disorders: Secondary | ICD-10-CM | POA: Insufficient documentation

## 2011-07-04 DIAGNOSIS — W19XXXA Unspecified fall, initial encounter: Secondary | ICD-10-CM | POA: Insufficient documentation

## 2011-07-04 MED ORDER — HYDROCODONE-ACETAMINOPHEN 5-325 MG PO TABS: 2.0000 | ORAL_TABLET | ORAL | Status: DC | PRN

## 2011-07-04 NOTE — ED Notes (Signed)
Pt fell on two weeks ago and injured his left middle finger pt presents with swelling and pain

## 2011-07-04 NOTE — ED Provider Notes (Signed)
History     CSN: [id number] Arrival date & time: 07/04/2011  8:33 PM  Chief Complaint  Patient presents with  . Finger Injury   Patient is a 60 y.o. male presenting with hand pain.  Hand Pain This is a new problem. The current episode started 1 to 4 weeks ago. The problem occurs constantly. The problem has been unchanged. Associated symptoms include joint swelling. The symptoms are aggravated by bending. He has tried nothing for the symptoms. The treatment provided no relief.  Hand Pain This is a new problem. The current episode started 1 to 4 weeks ago. The problem occurs constantly. The problem has been unchanged. The symptoms are aggravated by bending. He has tried nothing for the symptoms. The treatment provided no relief.  Pt complains of pain in left middle finger after falling 2 weeks ago.  Pt complains of swelling and pain  Past Medical History  Diagnosis Date  . Hepatitis C 1990    [facility]    Past Surgical History  Procedure Date  . Colonoscopy 2003  . Percutaneous liver biopsy 2006    Dr Arlyce Dice  . Tonsillectomy     Family History  Problem Relation Age of Onset  . Peripheral vascular disease Mother   . Alcohol abuse Father     History  Substance Use Topics  . Smoking status: Never Smoker   . Smokeless tobacco: Not on file  . Alcohol Use: Yes      Review of Systems  Musculoskeletal: Positive for joint swelling.  All other systems reviewed and are negative.    Physical Exam  BP 135/74  Pulse 65  Temp(Src) 98.2 F (36.8 C) (Oral)  Resp 18  SpO2 98%  Physical Exam  Nursing note and vitals reviewed. Constitutional: He appears well-developed and well-nourished.  Musculoskeletal: He exhibits edema and tenderness.  Neurological: He is alert.  Skin: Skin is warm.  Psychiatric: He has a normal mood and affect.  swollen finger decreased range of motion, nv and ns intact  ED Course  Procedures  MDM Pt referred to Dr. Pearletha Forge for followup.  Medical  screening examination/treatment/procedure(s) were performed by non-physician practitioner and as supervising physician I was immediately available for consultation/collaboration. Osvaldo Human, M.D.    [place], [place] 07/19/11 1240  Carleene Cooper III, MD 07/21/11 [phone]

## 2011-07-06 ENCOUNTER — Ambulatory Visit (INDEPENDENT_AMBULATORY_CARE_PROVIDER_SITE_OTHER): Payer: Self-pay | Admitting: Family Medicine

## 2011-07-06 VITALS — BP 129/78

## 2011-07-06 DIAGNOSIS — M79609 Pain in unspecified limb: Secondary | ICD-10-CM

## 2011-07-06 DIAGNOSIS — M79646 Pain in unspecified finger(s): Secondary | ICD-10-CM

## 2011-07-06 DIAGNOSIS — IMO0001 Reserved for inherently not codable concepts without codable children: Secondary | ICD-10-CM

## 2011-07-06 DIAGNOSIS — S6990XA Unspecified injury of unspecified wrist, hand and finger(s), initial encounter: Secondary | ICD-10-CM

## 2011-07-06 MED ORDER — TRAMADOL HCL 50 MG PO TABS: 50.0000 mg | ORAL_TABLET | Freq: Three times a day (TID) | ORAL | Status: DC | PRN

## 2011-07-06 NOTE — Patient Instructions (Addendum)
Wear the splint at all times with buddy taping for the next 2 weeks - do NOT take off. Ok to ice the area over the splint for 15 minutes at a time. Elevate above the level of your heart to help with swelling. Start physical therapy for your right knee now that the [facility] coverage has gone through. Follow up with me in 2 weeks for a recheck on your finger.

## 2011-07-08 ENCOUNTER — Encounter: Payer: Self-pay | Admitting: Family Medicine

## 2011-07-08 DIAGNOSIS — S66819A Strain of other specified muscles, fascia and tendons at wrist and hand level, unspecified hand, initial encounter: Secondary | ICD-10-CM | POA: Insufficient documentation

## 2011-07-08 NOTE — Assessment & Plan Note (Signed)
x-rays reviewed and were negative.  Did not have any skin breakdown when this occurred to suggest infectious cause of pain and swelling (exam also not consistent with this at this time).  He does have pain dorsally at PIP and DIP joints though has active extension and flexion at both joints.  Will treat conservatively and splint both PIP and DIP joints in extension with concern for partial central slip and mallet injuries.  Tramadol as needed for pain.  F/u in 2 weeks.  See instructions for further.

## 2011-07-08 NOTE — Progress Notes (Signed)
Subjective:    Patient ID: Noah [name], male    DOB: [birth year], 60 y.o.   MRN: [id number]  PCP: Dr. Alwyn Ren  HPI 60 yo M here for left 3rd finger injury.  Patient reports about 2 weeks ago his right knee buckled and he fell onto left hand. Thinks he jammed 3rd finger - became swollen, stiff, bruised. Has been alternating ice and heat for this. Went to ED on 8/26 and had x-rays that were negative for a fracture - put in extension splint for finger and advised to follow up here. He does report that his coverage with [facility] has finally gone through. Has been wearing splint since injury but this has started digging into his hand. Is right handed.  Past Medical History  Diagnosis Date  . Hepatitis C 1990    [facility]    Current Outpatient Prescriptions on File Prior to Visit  Medication Sig Dispense Refill  . ibuprofen (ADVIL,MOTRIN) 200 MG tablet Take 200 mg by mouth every 6 (six) hours as needed. pain       . mupirocin (BACTROBAN) 2 % cream Apply 1 application topically 2 (two) times daily.        . ranitidine (ZANTAC) 150 MG tablet Take 1 tablet (150 mg total) by mouth 2 (two) times daily.  60 tablet  1  . Tetrahydrozoline HCl (VISINE OP) Place 1 drop into both eyes daily as needed. Red itchy eyes         Past Surgical History  Procedure Date  . Colonoscopy 2003  . Percutaneous liver biopsy 2006    Dr Arlyce Dice  . Tonsillectomy     No Known Allergies  History   Social History  . Marital Status: Divorced    Spouse Name: N/A    Number of Children: N/A  . Years of Education: N/A   Occupational History  . Not on file.   Social History Main Topics  . Smoking status: Never Smoker   . Smokeless tobacco: Not on file  . Alcohol Use: Yes  . Drug Use: No  . Sexually Active: Not on file   Other Topics Concern  . Not on file   Social History Narrative  . No narrative on file    Family History  Problem Relation Age of Onset  . Peripheral vascular disease Mother   .  Alcohol abuse Father     BP 129/78  Review of Systems See HPI above.    Objective:   Physical Exam Gen: NAD L 3rd digit: Mild-mod swelling throughout digit but no warmth, erythema, red streaks.  No bruising. TTP greatest dorsally at DIP and PIP joints, mild pain at volar MCP with developing skin breakdown from volar extension splint. FROM MCP, PIP, and DIP joints - able to actively resist flexion and extension at all joints as well though with pain dorsally. Collateral ligaments intact of PIP and DIP joints. NVI distally with < 2 sec cap refill.     Assessment & Plan:  1. Left 3rd digit injury - x-rays reviewed and were negative.  Did not have any skin breakdown when this occurred to suggest infectious cause of pain and swelling (exam also not consistent with this at this time).  He does have pain dorsally at PIP and DIP joints though has active extension and flexion at both joints.  Will treat conservatively and splint both PIP and DIP joints in extension with concern for partial central slip and mallet injuries.  Tramadol as needed for pain.  F/u in 2 weeks.  See instructions for further.  2. Right knee pain - will start PT now that [facility] coverage has gone through as we had previously discussed.

## 2011-08-05 NOTE — Telephone Encounter (Signed)
error 

## 2011-08-12 ENCOUNTER — Telehealth: Payer: Self-pay | Admitting: Internal Medicine

## 2011-08-12 DIAGNOSIS — M25569 Pain in unspecified knee: Secondary | ICD-10-CM

## 2011-08-12 NOTE — Telephone Encounter (Signed)
The sports medicine specialist did not feel that narcotics were indicated for your symptoms. I recommend a second opinion if you do not agree with that specialist's  recommendations. I will be happy to refer him to a second specialist (orthopedist) for an opinion if he wishes.

## 2011-08-12 NOTE — Telephone Encounter (Signed)
Error-dup

## 2011-08-12 NOTE — Telephone Encounter (Signed)
Patient wants rx for

## 2011-08-12 NOTE — Telephone Encounter (Signed)
Left message to call office

## 2011-08-12 NOTE — Telephone Encounter (Signed)
PATIENT WILL BE GOING ON TRIP WITH HIS SON, HE NEEDS PROOF THAT HE JUST HAD CPX W/DR. HOPPER.  PT ALSO REQUESTS REFILL OF HYDROCODONE 5-325MG , STATES HIS KNEE PAIN IS STILL PRESENT.  [address] [facility], [address], [place], [place] [phone], FAX [phone].  PLEASE CALL PATIENT.

## 2011-08-12 NOTE — Telephone Encounter (Signed)
Spoke with patient, patient would like a copy of his CPX appointment, unable to find the one given at [facility]. Patient would like to let Dr.Hopper know he is unable to get an appointment with specialist until financial aid (through [employer]) approved. He has already completed paperwork and sent it off, waiting for reply.

## 2011-08-13 NOTE — Telephone Encounter (Signed)
Left message to call office

## 2011-08-16 ENCOUNTER — Emergency Department (HOSPITAL_BASED_OUTPATIENT_CLINIC_OR_DEPARTMENT_OTHER)
Admission: EM | Admit: 2011-08-16 | Discharge: 2011-08-16 | Disposition: A | Discharge status: Home / Self Care | Payer: Self-pay | Attending: Emergency Medicine | Admitting: Emergency Medicine

## 2011-08-16 ENCOUNTER — Encounter (HOSPITAL_BASED_OUTPATIENT_CLINIC_OR_DEPARTMENT_OTHER): Payer: Self-pay | Admitting: *Deleted

## 2011-08-16 ENCOUNTER — Telehealth: Payer: Self-pay | Admitting: Internal Medicine

## 2011-08-16 DIAGNOSIS — M25561 Pain in right knee: Secondary | ICD-10-CM

## 2011-08-16 DIAGNOSIS — M25569 Pain in unspecified knee: Secondary | ICD-10-CM | POA: Insufficient documentation

## 2011-08-16 DIAGNOSIS — S6990XA Unspecified injury of unspecified wrist, hand and finger(s), initial encounter: Secondary | ICD-10-CM

## 2011-08-16 DIAGNOSIS — Z79899 Other long term (current) drug therapy: Secondary | ICD-10-CM | POA: Insufficient documentation

## 2011-08-16 MED ORDER — HYDROCODONE-ACETAMINOPHEN 5-325 MG PO TABS: 1.0000 | ORAL_TABLET | Freq: Four times a day (QID) | ORAL | Status: DC | PRN

## 2011-08-16 NOTE — ED Notes (Signed)
Pt c/o right knee injury with pain and left 3rd finger injury with pain

## 2011-08-16 NOTE — Telephone Encounter (Deleted)
Patient needs referral for physical therepy - knee pain - h

## 2011-08-16 NOTE — Telephone Encounter (Signed)
Dr.Hopper please further advise

## 2011-08-16 NOTE — Telephone Encounter (Signed)
Patient wants referral for physical therepy - note from [phone] states dr hopper will refer to another orthoped since patient wasn't happy with orthoped he saw -needs to be in [facility] system - patient was approved for assistance -  2) patient also wants refill for hydrocodone 5-500mg 

## 2011-08-16 NOTE — ED Provider Notes (Signed)
History    Scribed for Noah Co, MD, the patient was seen in room MH05/MH05. This chart was scribed by Katha Cabal. This patient's care was started at 3:54 PM.   CSN: [id number] Arrival date & time: 08/16/2011  3:26 PM  Chief Complaint  Patient presents with  . Knee Injury  . Finger Injury    (Consider location/radiation/quality/duration/timing/severity/associated sxs/prior treatment) HPI  Noah [name] is a 60 y.o. male who presents to the Emergency Department complaining of ongoing left right knee pain that has been continuous since May 2012.  Patient has ran out of hydrocodone that was previously given for pain.  Patient also states that he fell and injuried his left 3rd finger.  Patient was seen earlier today at [facility] for same complaints.  Patient was seen several weeks ago in ED and had fluid drawn off knee and was referred to sports medicine.        Review of Systems  All other systems reviewed and are negative.    Allergies  Review of patient's allergies indicates no known allergies.  Home Medications   Current Outpatient Rx  Name Route Sig Dispense Refill  . VISINE OP Both Eyes Place 1 drop into both eyes daily as needed. Red itchy eyes     . IBUPROFEN 200 MG PO TABS Oral Take 200 mg by mouth every 6 (six) hours as needed. pain     . MUPIROCIN CALCIUM 2 % EX CREA Topical Apply 1 application topically 2 (two) times daily.      Marland Kitchen RANITIDINE HCL 150 MG PO TABS Oral Take 1 tablet (150 mg total) by mouth 2 (two) times daily. 60 tablet 1  . TRAMADOL HCL 50 MG PO TABS Oral Take 1 tablet (50 mg total) by mouth every 8 (eight) hours as needed. 90 tablet 0    BP 117/66  Pulse 50  Temp(Src) 98.3 F (36.8 C) (Oral)  Resp 18  Ht 5' 9.5" (1.765 m)  Wt 170 lb (77.111 kg)  BMI 24.74 kg/m2  SpO2 100%  Physical Exam  Constitutional: He is oriented to person, place, and time. He appears well-developed and well-nourished.  HENT:  Head: Normocephalic.  Eyes:  EOM are normal.  Neck: Normal range of motion.  Pulmonary/Chest: Effort normal.  Musculoskeletal: Normal range of motion.       Right knee: FROM, small joint effusion, mild tenderness medline, mild swollen proximal phalanx of middle finger  No erythema, no swelling,  buddy taped  Neurological: He is alert and oriented to person, place, and time.  Psychiatric: He has a normal mood and affect.    ED Course  Procedures (including critical care time)  Labs Reviewed - No data to display OTHER DATA REVIEWED: Nursing notes, vital signs, and past medical records reviewed.   DIAGNOSTIC STUDIES: Oxygen Saturation is 100% on room air, normal by my interpretation.     LABS / RADIOLOGY:  No results found.     ED COURSE / COORDINATION OF CARE: 4:05 PM  Physical exam complete.    No orders of the defined types were placed in this encounter.         MDM   MDM: Patient is well-appearing.  He appears to have chronic pain in his right knee and this is an old injury of his left middle finger.  No indication for imaging her treatment in the emergency department   IMPRESSION: Diagnoses that have been ruled out:  Diagnoses that are still under consideration:  Final diagnoses:  Pain in right knee  Finger injury     MEDICATIONS GIVEN IN THE E.D. Scheduled Meds:   Continuous Infusions:      DISCHARGE MEDICATIONS: New Prescriptions   HYDROCODONE-ACETAMINOPHEN (NORCO) 5-325 MG PER TABLET    Take 1 tablet by mouth every 6 (six) hours as needed for pain.     I personally performed the services described in this documentation, which was scribed in my presence. The recorded information has been reviewed and considered.           Noah Co, MD 08/16/11 [phone]

## 2011-08-16 NOTE — ED Notes (Signed)
Pt requesting vicodin refill

## 2011-08-16 NOTE — Telephone Encounter (Signed)
Physical therapy cannot prescribe narcotics. We need to have an orthopedic specialist to determine the optimal treatment for your knee problem. They may feel that narcotics are indicated or they may  recommend physical therapy.  Does he have preference of  orthopedist; the prior consult was with a sports medicine specialist.

## 2011-08-16 NOTE — Telephone Encounter (Signed)
Left message on machine with Dr.Hopper's response and informed patient to call back to further discuss

## 2011-08-18 ENCOUNTER — Other Ambulatory Visit: Payer: Self-pay | Admitting: Internal Medicine

## 2011-08-18 ENCOUNTER — Ambulatory Visit: Payer: Self-pay | Admitting: Family Medicine

## 2011-08-18 DIAGNOSIS — M25561 Pain in right knee: Secondary | ICD-10-CM

## 2011-08-18 NOTE — Telephone Encounter (Signed)
Patient left message on voicemail, patient would like referral. Patient states it needs to be someone within the [facility] system.  I called patient and left message on his voicemail informing patient message received. If patient with any additional concerns, patient to schedule [facility]

## 2011-08-18 NOTE — Telephone Encounter (Signed)
Pt states that he would like to be referred but [facility] to be to [facility] facility. [facility] with [facility]. See other telephone call.

## 2011-08-19 NOTE — Telephone Encounter (Signed)
Per Dr.Hopper patient will see Orthopedist

## 2011-08-23 NOTE — Telephone Encounter (Signed)
Patient came by the office to speak with me instead of returning my call.  I very clearly tried to explain Dr. Frederik Pear response in this phone note, and also that he can see another doctor within [facility] at a different location, however they are the same practice.  He somewhat understood that, but today, had me call Dr. Lazaro Arms office & schedule him an appt to discuss his ongoing pain, and in his opinion, his need for narcotic medication.  Once he has his appt with Dr. Pearletha Forge, patient states he will let me know if he desires to be scheduled to be seen by another [facility] specialist.

## 2011-08-23 NOTE — Telephone Encounter (Signed)
I left message for patient to return my call.  To stay within the [facility] system & see an orthopedist, he will be seeing someone at a different location, but they are the same practice.  I will discuss with patient when he returns call.

## 2011-08-26 ENCOUNTER — Encounter: Payer: Self-pay | Admitting: Family Medicine

## 2011-08-26 ENCOUNTER — Other Ambulatory Visit: Payer: Self-pay | Admitting: Family Medicine

## 2011-08-26 ENCOUNTER — Ambulatory Visit (INDEPENDENT_AMBULATORY_CARE_PROVIDER_SITE_OTHER): Payer: Self-pay | Admitting: Family Medicine

## 2011-08-26 VITALS — BP 134/89 | HR 56 | Temp 97.7°F | Ht 70.0 in | Wt 175.0 lb

## 2011-08-26 DIAGNOSIS — M2540 Effusion, unspecified joint: Secondary | ICD-10-CM

## 2011-08-26 DIAGNOSIS — M25561 Pain in right knee: Secondary | ICD-10-CM

## 2011-08-26 DIAGNOSIS — IMO0001 Reserved for inherently not codable concepts without codable children: Secondary | ICD-10-CM

## 2011-08-26 DIAGNOSIS — M25569 Pain in unspecified knee: Secondary | ICD-10-CM

## 2011-08-26 DIAGNOSIS — S6980XA Other specified injuries of unspecified wrist, hand and finger(s), initial encounter: Secondary | ICD-10-CM

## 2011-08-26 MED ORDER — MELOXICAM 15 MG PO TABS: 15.0000 mg | ORAL_TABLET | Freq: Every day | ORAL | Status: DC

## 2011-08-26 NOTE — Assessment & Plan Note (Signed)
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  Continue icing, elevation, wrapping.  He does have [facility] coverage but would not be able to get him in with a surgeon for arthroscopy with this should he not improve with conservative measures.  After informed written consent, patient was seated on exam table. Right knee was prepped with alcohol swab and utilizing anteromedial approach, patient's right knee was injected intraarticularly with 3:1 marcaine: depomedrol. Patient tolerated the procedure well without immediate complications.

## 2011-08-26 NOTE — Progress Notes (Addendum)
Subjective:    Patient ID: Noah [name], male    DOB: [birth year], 60 y.o.   MRN: [id number]  PCP: Dr. Alwyn Ren  HPI  60 yo M here for f/u left 3rd finger injury, right knee pain.  8/28: Patient reports about 2 weeks ago his right knee buckled and he fell onto left hand. Thinks he jammed 3rd finger - became swollen, stiff, bruised. Has been alternating ice and heat for this. Went to ED on 8/26 and had x-rays that were negative for a fracture - put in extension splint for finger and advised to follow up here. He does report that his coverage with [facility] has finally gone through. Has been wearing splint since injury but this has started digging into his hand. Is right handed.  10/18: Patient was written to go to PT for his right knee but he did not go. Has continued to have pain and swelling in right knee. He reports the injection did work over time until the last few weeks when it flared up again. Pain is medial. No catching/locking. + swelling but no bruising. No new injuries since last visit. For his left 3rd digit he was to follow-up with [place] in 2 weeks but he did not return to the office. He has not been using extension splint or buddy taping digits and is now about 2 months out from his initial injury. Still with swelling and now has volar pain, not dorsal pain. No fever, redness.  Did not have a break in skin when he fell. X-rays on 8/26 were negative of his finger.  Past Medical History  Diagnosis Date  . Hepatitis C 1990    [facility]    Current Outpatient Prescriptions on File Prior to Visit  Medication Sig Dispense Refill  . mupirocin (BACTROBAN) 2 % cream Apply 1 application topically 2 (two) times daily.        . ranitidine (ZANTAC) 150 MG tablet Take 1 tablet (150 mg total) by mouth 2 (two) times daily.  60 tablet  1  . Tetrahydrozoline HCl (VISINE OP) Place 1 drop into both eyes daily as needed. Red itchy eyes       . traMADol (ULTRAM) 50 MG tablet Take 1 tablet (50  mg total) by mouth every 8 (eight) hours as needed.  90 tablet  0    Past Surgical History  Procedure Date  . Colonoscopy 2003  . Percutaneous liver biopsy 2006    Dr Arlyce Dice  . Tonsillectomy     No Known Allergies  History   Social History  . Marital Status: Divorced    Spouse Name: N/A    Number of Children: N/A  . Years of Education: N/A   Occupational History  . Not on file.   Social History Main Topics  . Smoking status: Never Smoker   . Smokeless tobacco: Not on file  . Alcohol Use: Yes  . Drug Use: No  . Sexually Active: Not on file   Other Topics Concern  . Not on file   Social History Narrative  . No narrative on file    Family History  Problem Relation Age of Onset  . Peripheral vascular disease Mother   . Alcohol abuse Father     BP 134/89  Pulse 56  Temp(Src) 97.7 F (36.5 C) (Oral)  Ht 5\' 10"  (1.778 m)  Wt 175 lb (79.379 kg)  BMI 25.11 kg/m2  Review of Systems  See HPI above.    Objective:   Physical  Exam  Gen: NAD L 3rd digit: Mod swelling throughout digit but no warmth, erythema, red streaks.  No bruising. TTP volar area over MCP to PIP joints.  No dorsal TTP. Able to actively flex and extend at MCP, PIP, and DIP joints - lacks 30 degrees flexion of MCP and PIP due to swelling, stiffness.   No pain with passive extension of 3rd digit. Collateral ligaments intact of PIP and DIP joints. NVI distally with < 2 sec cap refill.  R knee: Mild effusion (confirmed by ultrasound).  No gross deformity, ecchymoses. Mod medial joint line TTP.  No lateral joint line, post patellar facet, or other TTP about right knee. FROM. Negative ant/post drawers. Negative valgus/varus testing. Negative lachmanns. Mild pain medial joint line with mcmurrays, apleys but no click, patellar apprehension, clarkes. NV intact distally.    Assessment & Plan:  1. Left 3rd digit injury - x-rays reviewed and were negative.  His tendons are intact on exam and no  collateral ligament tears.  Concerned regarding persistent swelling and stiffness however.  No fever, areas not warm and no erythema, no pain with passive flexion to suggest tenosynovitis or felon.  Will move forward with MRI of digit to further assess for other injuries.  If normal, will consider PT for this as well as knee.  Continue tylenol, tramadol as needed for pain, meloxicam.    2. 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  Continue icing, elevation, wrapping.  He does have [facility] coverage but would not be able to get him in with a surgeon for arthroscopy with this should he not improve with conservative measures.  After informed written consent, patient was seated on exam table. Right knee was prepped with alcohol swab and utilizing anteromedial approach, patient's right knee was injected intraarticularly with 3:1 marcaine: depomedrol. Patient tolerated the procedure well without immediate complications.  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  He needs to see a hand specialist to likely have surgical repair but patient does not have insurance.  Only option after speaking with other groups in town, Rudell Cobb, is to refer to [facility] - filled out paperwork which they will review and await their decision on if they will take his case.  He could add PT for this but with ulnar slip retraction, persistent pain now 2 months out, swelling, think operative repair is likely necessary.  Advised him would call in vicodin 5/500 q6h prn #[id number].  Long discussion that we will not refill this medicine but would be happy to refer him to [facility] as patient noted his PCP does not  treat pain and defers to specialists.  He stated either the tramadol or mobic made him have strange dreams - this was likely due to tramadol - has tolerated hydrocodone in past.  There is the possibility that if/when he gets care at [facility] he could have further assessment and possible surgery on knee if MRI shows meniscal injury.  Not ordering MRI on knee because this would not change the fact that we can only offer conservative measures for his pain (injections, PT, etc) and without locking of knee, this is not a surgical  urgency.

## 2011-08-26 NOTE — Patient Instructions (Signed)
You were given a shot into your right knee to help with the pain. Take meloxicam 15mg  daily with food for pain and inflammation. As we discussed before, the tramadol (narcotic pain medicine) is the strongest narcotic we give for pain. You can take tylenol in addition to the above for pain. We will order an MRI of your finger because it's still swollen, stiff, and painful and the testing so far hasn't pointed to a cause for this. Start physical therapy for your knee. I will contact you regarding the MRI results for your finger and how to proceed. At this point you can now stop using taping or splinting.

## 2011-08-26 NOTE — Assessment & Plan Note (Signed)
x-rays reviewed and were negative.  His tendons are intact on exam and no collateral ligament tears.  Concerned regarding persistent swelling and stiffness however.  No fever, areas not warm and no erythema, no pain with passive flexion to suggest tenosynovitis or felon.  Will move forward with MRI of digit to further assess for other injuries.  If normal, will consider PT for this as well as knee.  Continue tylenol, tramadol as needed for pain, meloxicam.

## 2011-08-28 ENCOUNTER — Other Ambulatory Visit (HOSPITAL_BASED_OUTPATIENT_CLINIC_OR_DEPARTMENT_OTHER): Payer: Self-pay

## 2011-08-31 ENCOUNTER — Ambulatory Visit: Payer: Self-pay | Attending: Family Medicine | Admitting: Physical Therapy

## 2011-08-31 ENCOUNTER — Ambulatory Visit (HOSPITAL_BASED_OUTPATIENT_CLINIC_OR_DEPARTMENT_OTHER): Admission: RE | Admit: 2011-08-31 | Payer: Self-pay | Source: Ambulatory Visit

## 2011-08-31 DIAGNOSIS — IMO0001 Reserved for inherently not codable concepts without codable children: Secondary | ICD-10-CM | POA: Insufficient documentation

## 2011-08-31 DIAGNOSIS — M25569 Pain in unspecified knee: Secondary | ICD-10-CM | POA: Insufficient documentation

## 2011-09-01 ENCOUNTER — Ambulatory Visit (INDEPENDENT_AMBULATORY_CARE_PROVIDER_SITE_OTHER): Payer: Self-pay | Admitting: Internal Medicine

## 2011-09-01 ENCOUNTER — Ambulatory Visit: Payer: Self-pay | Admitting: Physical Therapy

## 2011-09-01 VITALS — BP 120/78 | HR 58 | Temp 97.8°F | Wt 176.2 lb

## 2011-09-01 DIAGNOSIS — G8929 Other chronic pain: Secondary | ICD-10-CM

## 2011-09-01 DIAGNOSIS — M25569 Pain in unspecified knee: Secondary | ICD-10-CM

## 2011-09-01 NOTE — Progress Notes (Signed)
  Subjective:    Patient ID: Noah [name], male    DOB: 1950/12/28, 60 y.o.   MRN: [id number]  HPI    He has been engaged in outpatient rehabilitation for his knee. He continues to buckle on him. He continues to have pain. He feels that Tylenol causes hallucinations. This was the medication recommended by the Sports Specialist Dr. Pearletha Forge. Apparently Dr. Pearletha Forge  diagnostic ultrasound the knee. Apparently MRI was not felt to be necessary.  He believes that the knee is affecting his back because of altered gait.  Dr. Lazaro Arms record was reviewed. He questioned degenerative changes or possible meniscal tear  in the right knee.  He also evaluated the posttraumatic pain in the digit. MRI apparently had to be rescheduled because of an emergency on the part of the facility.      Review of Systems     Objective:   Physical Exam  On exam he appears well-nourished and in no acute distress.  The right knee reveals no effusion. There is full range of motion of the lower extremity. There is a slight click of the patella with flexion but no significant crepitus.  Strength in his right leg is normal opposition. The knee reflexes also normal. Pedal pulses are normal.  The third left digit is swollen and has decreased flexion        Assessment & Plan:  #1 knee pain, persistent. Further evaluation is needed. His coverage is through [facility] as part of financial assistance program.  #2 posttraumatic changes third left finger; MRI pending  #3 mental status changes, hallucinations, with tramadol. This would contraindicate hydrocodone hydrocodone products.

## 2011-09-04 ENCOUNTER — Ambulatory Visit (HOSPITAL_BASED_OUTPATIENT_CLINIC_OR_DEPARTMENT_OTHER)
Admission: RE | Admit: 2011-09-04 | Discharge: 2011-09-04 | Disposition: A | Discharge status: Home / Self Care | Payer: Self-pay | Source: Ambulatory Visit | Attending: Family Medicine | Admitting: Family Medicine

## 2011-09-04 DIAGNOSIS — M79609 Pain in unspecified limb: Secondary | ICD-10-CM

## 2011-09-04 DIAGNOSIS — M2540 Effusion, unspecified joint: Secondary | ICD-10-CM

## 2011-09-04 DIAGNOSIS — S63639A Sprain of interphalangeal joint of unspecified finger, initial encounter: Secondary | ICD-10-CM | POA: Insufficient documentation

## 2011-09-04 DIAGNOSIS — M65849 Other synovitis and tenosynovitis, unspecified hand: Secondary | ICD-10-CM | POA: Insufficient documentation

## 2011-09-04 DIAGNOSIS — X58XXXA Exposure to other specified factors, initial encounter: Secondary | ICD-10-CM | POA: Insufficient documentation

## 2011-09-04 DIAGNOSIS — M65839 Other synovitis and tenosynovitis, unspecified forearm: Secondary | ICD-10-CM | POA: Insufficient documentation

## 2011-09-04 DIAGNOSIS — R209 Unspecified disturbances of skin sensation: Secondary | ICD-10-CM | POA: Insufficient documentation

## 2011-09-06 MED ORDER — HYDROCODONE-ACETAMINOPHEN 5-325 MG PO TABS: 1.0000 | ORAL_TABLET | Freq: Four times a day (QID) | ORAL | Status: AC | PRN

## 2011-09-06 NOTE — Progress Notes (Signed)
Addended by: Lenda Kelp on: 09/06/2011 12:47 PM   Modules accepted: Orders

## 2011-09-07 ENCOUNTER — Ambulatory Visit: Payer: Self-pay | Admitting: Physical Therapy

## 2011-09-09 ENCOUNTER — Ambulatory Visit: Payer: Self-pay | Attending: Family Medicine | Admitting: Physical Therapy

## 2011-09-09 DIAGNOSIS — M25569 Pain in unspecified knee: Secondary | ICD-10-CM | POA: Insufficient documentation

## 2011-09-09 DIAGNOSIS — IMO0001 Reserved for inherently not codable concepts without codable children: Secondary | ICD-10-CM | POA: Insufficient documentation

## 2011-09-14 ENCOUNTER — Encounter: Payer: Self-pay | Admitting: Physical Therapy

## 2011-09-16 ENCOUNTER — Encounter: Payer: Self-pay | Admitting: Physical Therapy

## 2011-10-04 ENCOUNTER — Ambulatory Visit: Payer: Self-pay | Admitting: Family Medicine

## 2011-12-08 ENCOUNTER — Encounter: Payer: Self-pay | Attending: Neurosurgery | Admitting: Neurosurgery

## 2011-12-08 DIAGNOSIS — M25569 Pain in unspecified knee: Secondary | ICD-10-CM

## 2011-12-08 DIAGNOSIS — M25549 Pain in joints of unspecified hand: Secondary | ICD-10-CM

## 2011-12-08 DIAGNOSIS — M62838 Other muscle spasm: Secondary | ICD-10-CM | POA: Insufficient documentation

## 2011-12-08 DIAGNOSIS — R209 Unspecified disturbances of skin sensation: Secondary | ICD-10-CM | POA: Insufficient documentation

## 2011-12-08 DIAGNOSIS — M79609 Pain in unspecified limb: Secondary | ICD-10-CM | POA: Insufficient documentation

## 2011-12-09 NOTE — Progress Notes (Signed)
This is a patient that was referred over here from Dr. Ceasar Lund office for pain in his left middle digit as well as right knee pain. The patient is a very poor historian.  He brought no medicine bottles with him today, does not know any medicines or doses or who prescribes them other than the fact that he takes hydrocodone.  The patient tells me he has been evaluated at [facility] by a "hand surgeon" for his left finger which is actually his third digit on his left hand, who obtained an MRI and told him there was nothing to do about it, and gave him no treatment options.  He also states he has had cortisone injections, as well as fluid drawn of his right knee and again do not remember exactly who did it or when.  The patient presents somewhat disheveled, he is again very poor historian in every fact.  He stutters quite a bit as well as states, he is dyslexic, although he mispronounced and was supposed that is what he meant, but again he is not sure any of his conditions or treatment.  He reports coming to the clinic today for spasms in his chest as well as numbness in the third digit on his left hand and his right knee having fluid on it.  He rates his pain as 7-9, it is a stabbing, tingling pain.  General activity level is 4-7.  The pain is worse in the evening and night.  Sleep patterns are poor.  All activities aggravate rest, tends to help.  He walks without assistance. He does not drive.  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  REVIEW OF SYSTEMS:  Notable for difficulties as described above as well as some weight gain, limb swelling, shortness of breath, respiratory infections, abdominal pain, trouble walking, spasms, paresthesias, bladder and bowel control issues.  No apparent suicidal thoughts or aberrant behaviors.  UDS was collected today.  Past physicians involved in his care are  unnamed, I suppose Dr. Vincenza Hews Hudnall's once since he referred him, all he wrote on his sheet was gas.  PAST MEDICAL HISTORY:  Significant for liver disease.  He did not mark anything else or any other surgeries.  SOCIAL HISTORY:  He is divorced.  FAMILY HISTORY:  Significant for alcohol abuse.Marland Kitchen  PHYSICAL EXAMINATION:  VITAL SIGNS:  His blood pressure is 101/60, pulse 70, respirations 16, O2 sats 96 on room air. GENERAL:  Constitutionally, he is disheveled.  He is alert and oriented x3, although somewhat anxious and again hard to follow in conversation. MUSCULOSKELETAL:  His reflexes are trace in the upper and lower extremities bilaterally, although he talked through the entire exam and was quite tense, I could not get him to relax to obtain any proper reflexes in my opinion.  Sensation appears to be intact in upper and lower extremities to pinprick.  His strength is 5/5 in the upper and lower extremities including the deltoid, biceps, triceps, intrinsics, grip, iliopsoas, quadriceps, dorsiflexors, EHL, and plantar flexors. His toes are equivocal.  He has a normal gait and stance.  There is some very mild crepitus at the patellofemoral joint laterally in his right knee, but no more so than the left.  His flexor tendons appear to be a little tight in his third digit on the left hand, but again compared to the right hand, I did not see a large difference.  ASSESSMENT: 1. Subjective pain in the third digit, left hand, unknown etiology 2. Pain in right knee with a history of needle aspiration and     cortisone injection without any proper history of who did it or     when.  PLAN:  UDS was collected today.  The patient is aware that we are not prescribing the medicines until this comes back.  He is going to follow up with Dr. Riley Kill in 2-4 weeks.  He also knows that if Dr. Riley Kill does not do the necessary, he will not be prescribing narcotic medications and his treatment options may  be limited.  Otherwise his questions were encouraged and answered.  He will follow up here with Dr. Riley Kill.     Noah [name] Electronically Signed    RLW/MedQ D:  12/08/2011 15:17:19  T:  12/09/2011 03:57:00  Job #:  [id number]

## 2011-12-10 ENCOUNTER — Encounter: Payer: Self-pay | Admitting: Neurosurgery

## 2012-01-10 ENCOUNTER — Encounter: Payer: Self-pay | Attending: Neurosurgery | Admitting: Physical Medicine & Rehabilitation

## 2012-01-10 ENCOUNTER — Encounter: Payer: Self-pay | Admitting: Physical Medicine & Rehabilitation

## 2012-01-10 DIAGNOSIS — M79609 Pain in unspecified limb: Secondary | ICD-10-CM | POA: Insufficient documentation

## 2012-01-10 DIAGNOSIS — R209 Unspecified disturbances of skin sensation: Secondary | ICD-10-CM | POA: Insufficient documentation

## 2012-01-10 DIAGNOSIS — M25569 Pain in unspecified knee: Secondary | ICD-10-CM | POA: Insufficient documentation

## 2012-01-10 DIAGNOSIS — S66819A Strain of other specified muscles, fascia and tendons at wrist and hand level, unspecified hand, initial encounter: Secondary | ICD-10-CM

## 2012-01-10 DIAGNOSIS — M1710 Unilateral primary osteoarthritis, unspecified knee: Secondary | ICD-10-CM

## 2012-01-10 DIAGNOSIS — IMO0001 Reserved for inherently not codable concepts without codable children: Secondary | ICD-10-CM

## 2012-01-10 DIAGNOSIS — M62838 Other muscle spasm: Secondary | ICD-10-CM | POA: Insufficient documentation

## 2012-01-10 DIAGNOSIS — IMO0002 Reserved for concepts with insufficient information to code with codable children: Secondary | ICD-10-CM

## 2012-01-10 DIAGNOSIS — S6990XA Unspecified injury of unspecified wrist, hand and finger(s), initial encounter: Secondary | ICD-10-CM

## 2012-01-10 DIAGNOSIS — M66339 Spontaneous rupture of flexor tendons, unspecified forearm: Secondary | ICD-10-CM

## 2012-01-10 DIAGNOSIS — M1711 Unilateral primary osteoarthritis, right knee: Secondary | ICD-10-CM

## 2012-01-10 MED ORDER — MELOXICAM 7.5 MG PO TABS: 15.0000 mg | ORAL_TABLET | Freq: Every day | ORAL | Status: DC

## 2012-01-10 NOTE — Progress Notes (Addendum)
Subjective:    Patient ID: Noah [name], male    DOB: [birth year], 61 y.o.   MRN: [id number]  HPI  Increased right knee pain since 5/12. He "woke up and it was swollen".  He doesn't recall a specific incident that led to the injury.  They drained knee in the summer.  He had a knee injection in the fall. He started therapy for the knee over the holidays. He fell because of knee pain in October and injured his left 3rd finger. He's used hydrocodone for breakthrough pain which helps some.  Tramadol wasn't effective. He's used flexeril and naproxen also without a lot of success. He uses ice and heat to varying degree.  He notes the knee swells. He can walk about 10 minutes.   He has ongoing tenderness in the left middle finger. MRI revealed tear of ulnar/radial slip of FDS and ultimately it was decided that surgery wasn't an option.  He was urine drug tested at last visit here and was positive for THC, methadone, and hydrocodone.  Pain Inventory Average Pain 8 Pain Right Now 7 My pain is constant, sharp and tingling  In the last 24 hours, has pain interfered with the following? General activity 2 Relation with others 5 Enjoyment of life 7 What TIME of day is your pain at its worst? morning and daytime Sleep (in general) Poor  Pain is worse with: walking, bending, sitting, inactivity and standing Pain improves with: heat/ice and injections Relief from Meds: 2  Mobility walk without assistance how many minutes can you walk? 10 ability to climb steps?  yes do you drive?  yes Do you have any goals in this area?  yes  Function not employed: date last employed 2010 I need assistance with the following:  household duties  Neuro/Psych trouble walking spasms depression anxiety  Prior Studies Any changes since last visit?  no  Physicians involved in your care Any changes since last visit?  no       Review of Systems  Constitutional: Positive for diaphoresis and  unexpected weight change.       Night sweats, weight gain  HENT: Positive for hearing loss.        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   Respiratory: Negative.   Cardiovascular: Positive for chest pain.       Describes as burning mostly in the morning. Laying flat makes it better.  Gastrointestinal: Positive for abdominal pain and constipation.  Genitourinary: Positive for dysuria and penile swelling.       Not a new issue, but says a knot is present internally his penis for last few months-not painful  Musculoskeletal: Positive for joint swelling, arthralgias and gait problem.       Right knee  Skin: Negative.   Neurological: Positive for weakness and numbness.       Left middle finger  Hematological: Bruises/bleeds easily.  Psychiatric/Behavioral: Positive for dysphoric mood. The patient is nervous/anxious.        Objective:   Physical Exam  Constitutional: He is oriented to person, place, and time. He appears well-developed.  HENT:  Head: Normocephalic.  Eyes: Pupils are equal, round, and reactive to light.  Neck: Normal range of motion.  Cardiovascular: Normal rate.   Pulmonary/Chest: Effort normal.  Abdominal: Soft.  Musculoskeletal:       Right Knee with peripatellar swelling. He has medial joint line pain. Meniscal maneuvers were positive on the medial  aspect. Minimal crepitus was seen. No knee instability was noted. Strength in the leg is within normal limits. No warmth or discoloration is seen.  Pt lacks full flexion of left 3rd DIP.  Neurological: He is alert and oriented to person, place, and time. No cranial nerve deficit. He displays a negative Romberg sign.  Skin: Skin is warm.          Assessment & Plan:  1. 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  -injected 3cc 1% lidocaine and 40mg  kenalog via medial approach after informed consent and sterile prep  -meloxicam 15 mg po qday-  watch LFT's  -continue with PT thru [facility]  -no narcotics at this time  -UDS at next visit   2. Left 3rd flexor tendon tear  -pain mgt, conservative care.  3. F/U with RN next month for UDS and I'll see him back in about 2 months.

## 2012-01-10 NOTE — Patient Instructions (Signed)
Continue with PT as you were in the process of doing.

## 2012-02-15 ENCOUNTER — Encounter: Payer: Self-pay | Admitting: Physical Medicine & Rehabilitation

## 2012-02-15 ENCOUNTER — Encounter: Payer: Self-pay | Attending: Neurosurgery | Admitting: Physical Medicine & Rehabilitation

## 2012-02-15 VITALS — BP 139/69 | HR 58 | Resp 16 | Ht 69.5 in | Wt 180.0 lb

## 2012-02-15 DIAGNOSIS — M47817 Spondylosis without myelopathy or radiculopathy, lumbosacral region: Secondary | ICD-10-CM | POA: Insufficient documentation

## 2012-02-15 DIAGNOSIS — M66339 Spontaneous rupture of flexor tendons, unspecified forearm: Secondary | ICD-10-CM

## 2012-02-15 DIAGNOSIS — M170 Bilateral primary osteoarthritis of knee: Secondary | ICD-10-CM

## 2012-02-15 DIAGNOSIS — M1710 Unilateral primary osteoarthritis, unspecified knee: Secondary | ICD-10-CM | POA: Insufficient documentation

## 2012-02-15 DIAGNOSIS — M47816 Spondylosis without myelopathy or radiculopathy, lumbar region: Secondary | ICD-10-CM

## 2012-02-15 DIAGNOSIS — IMO0002 Reserved for concepts with insufficient information to code with codable children: Secondary | ICD-10-CM | POA: Insufficient documentation

## 2012-02-15 DIAGNOSIS — S66819A Strain of other specified muscles, fascia and tendons at wrist and hand level, unspecified hand, initial encounter: Secondary | ICD-10-CM

## 2012-02-15 DIAGNOSIS — M1711 Unilateral primary osteoarthritis, right knee: Secondary | ICD-10-CM

## 2012-02-15 MED ORDER — METHOCARBAMOL 500 MG PO TABS: 500.0000 mg | ORAL_TABLET | Freq: Four times a day (QID) | ORAL | Status: AC

## 2012-02-15 NOTE — Progress Notes (Signed)
Subjective:    Patient ID: Noah [name], male    DOB: 1951-07-23, 61 y.o.   MRN: [id number]  HPI  Noah [name] is back regarding his right knee pain.  He had good results with the knee injection and meloxicam.  He previously had tested positive for THC, methadone, and hydrocodone. He wears a knee brace to support the knee.  He reports that his back is his biggest prblem at present.  He has uses a few left over hydrocodone for breakthrough pain. Walking seems to bother his back the most as well as bending. He finds it difficult to get out of the car.  He says that walking longer distances irritates his back. He cut his grass this AM with a push mower. He also reports some intermittent pain in his left shoulder region/trapezius  He didn't start the meloxicam  Pain Inventory Average Pain 7 Pain Right Now 8 My pain is constant, sharp, burning and aching  In the last 24 hours, has pain interfered with the following? General activity 7 Relation with others 5 Enjoyment of life 7 What TIME of day is your pain at its worst? eveing and night Sleep (in general) Poor  Pain is worse with: walking, bending, sitting, inactivity, standing and some activites Pain improves with: rest, therapy/exercise, medication and injections Relief from Meds: 8  Mobility walk without assistance how many minutes can you walk? 10 ability to climb steps?  no do you drive?  yes  Function not employed: date last employed 2012 I need assistance with the following:  household duties  Neuro/Psych bladder control problems bowel control problems trouble walking spasms  Prior Studies Any changes since last visit?  no  Physicians involved in your care Any changes since last visit?  no      Review of Systems  Musculoskeletal: Positive for arthralgias and gait problem.       Finger pain and spasm  All other systems reviewed and are negative.       Objective:   Physical Exam  Nursing note  reviewed. Constitutional: He is oriented to person, place, and time. He appears well-developed and well-nourished.  HENT:  Head: Normocephalic.  Eyes: Conjunctivae and EOM are normal. Pupils are equal, round, and reactive to light.  Neck: Normal range of motion.  Cardiovascular: Normal rate and regular rhythm.   Pulmonary/Chest: Effort normal.  Abdominal: Soft. Bowel sounds are normal.  Musculoskeletal:       Right knee: He exhibits normal range of motion, no swelling, no effusion, normal alignment, no LCL laxity and no MCL laxity. tenderness found.       Low back is limited with flexion.  He could bend to about 80 degrees and could extedn to 20 degrees. He has spasm in lumbar paraspinals right greater than left.    SLR was negative.  Posture was a bit rigid and he favors the right side more with gait.  She had generalized tenderness over left trapezius with palpation. He did have gross limitation of his cervical range of motion nor left shoulder range of motion.    Neurological: He is alert and oriented to person, place, and time. He has normal strength and normal reflexes. No cranial nerve deficit or sensory deficit.  Skin: Skin is warm.  Psychiatric: He has a normal mood and affect. His behavior is normal. Judgment and thought content normal.          Assessment & Plan:  1. Right Knee Osteoarthritis with likely medial meniscus  tear.-responded well to injection.  -encouraged use of a right knee sleeve and appopriate safety and strengthening of LRE  -start meloxicam 7.5mg  - only use for one month given history of increased LFTs in the past. -HEP -no narcotics at this time pending UDS  2. Left 3rd flexor tendon tear  -pain mgt, conservative care.  3. Low back pain. Appears to be mostly mechanical/muscular  -given his finances, we will stay conservative -begin robaxin 500 q6hr prn -meloxicam as above -low back exercises were provided. 4. F/u with me in 2 months

## 2012-02-15 NOTE — Patient Instructions (Signed)

## 2012-02-16 ENCOUNTER — Encounter: Payer: Self-pay | Admitting: Physical Medicine & Rehabilitation

## 2012-04-11 ENCOUNTER — Ambulatory Visit: Payer: Self-pay | Admitting: Physical Medicine & Rehabilitation

## 2012-04-21 ENCOUNTER — Encounter: Payer: Self-pay | Admitting: Physical Medicine & Rehabilitation

## 2012-04-21 ENCOUNTER — Encounter: Payer: Self-pay | Attending: Neurosurgery | Admitting: Physical Medicine & Rehabilitation

## 2012-04-21 VITALS — BP 120/78 | HR 56 | Resp 14 | Ht 69.0 in | Wt 178.0 lb

## 2012-04-21 DIAGNOSIS — M47817 Spondylosis without myelopathy or radiculopathy, lumbosacral region: Secondary | ICD-10-CM | POA: Insufficient documentation

## 2012-04-21 DIAGNOSIS — M1710 Unilateral primary osteoarthritis, unspecified knee: Secondary | ICD-10-CM

## 2012-04-21 DIAGNOSIS — M66339 Spontaneous rupture of flexor tendons, unspecified forearm: Secondary | ICD-10-CM | POA: Insufficient documentation

## 2012-04-21 DIAGNOSIS — S66819A Strain of other specified muscles, fascia and tendons at wrist and hand level, unspecified hand, initial encounter: Secondary | ICD-10-CM

## 2012-04-21 DIAGNOSIS — IMO0002 Reserved for concepts with insufficient information to code with codable children: Secondary | ICD-10-CM | POA: Insufficient documentation

## 2012-04-21 DIAGNOSIS — B1710 Acute hepatitis C without hepatic coma: Secondary | ICD-10-CM | POA: Insufficient documentation

## 2012-04-21 DIAGNOSIS — M47816 Spondylosis without myelopathy or radiculopathy, lumbar region: Secondary | ICD-10-CM

## 2012-04-21 DIAGNOSIS — M1711 Unilateral primary osteoarthritis, right knee: Secondary | ICD-10-CM

## 2012-04-21 MED ORDER — METHOCARBAMOL 500 MG PO TABS: 500.0000 mg | ORAL_TABLET | Freq: Four times a day (QID) | ORAL | Status: AC | PRN

## 2012-04-21 MED ORDER — MELOXICAM 7.5 MG PO TABS: 15.0000 mg | ORAL_TABLET | Freq: Every day | ORAL | Status: DC | PRN

## 2012-04-21 NOTE — Patient Instructions (Signed)
ICE TO RIGHT KNEE  HEAT TO BACK  GLUCOSAMINE WITH CHONDROITIN CAN BE GOOD FOR KNEE PAIN----OVER THE COUNTER   YOU NEED TO STRENGTHEN YOUR RIGHT QUADS AND HAMSTRINGS  WORK ON AEROBIC CONDITIONING ALSO.  PRACTICE GOOD POSTURE

## 2012-04-21 NOTE — Progress Notes (Signed)
Subjective:    Patient ID: Noah [name], male    DOB: Oct 28, 1951, 61 y.o.   MRN: [id number]  HPI  Noah [name] is back regarding his knee and back pain. His right knee has been fair. He has used the meloxicam which helps.  He puts on heat on the knee.  He does his back exercises daily.  (10 minutes).  He is walking about a mile per day.  His upper back seems to be bothering him more today especially below the left scapula.  He is out of the robaxin which was helping.   His finger is still tender at times but hasn't worsened.  Pain Inventory Average Pain 7 Pain Right Now 7 My pain is intermittent and sharp  In the last 24 hours, has pain interfered with the following? General activity 7 Relation with others 4 Enjoyment of life 7 What TIME of day is your pain at its worst? morning, day and evening Sleep (in general) Fair  Pain is worse with: walking, bending, sitting, inactivity and standing Pain improves with: rest and therapy/exercise Relief from Meds: 5  Mobility walk without assistance how many minutes can you walk? 10  Function not employed: date last employed 2012  Neuro/Psych weakness trouble walking spasms  Prior Studies Any changes since last visit?  no  Physicians involved in your care Any changes since last visit?  no   Family History  Problem Relation Age of Onset  . Peripheral vascular disease Mother   . Alcohol abuse Father 31   History   Social History  . Marital Status: Divorced    Spouse Name: N/A    Number of Children: N/A  . Years of Education: N/A   Social History Main Topics  . Smoking status: Never Smoker   . Smokeless tobacco: Never Used  . Alcohol Use: Yes  . Drug Use: No  . Sexually Active: None   Other Topics Concern  . None   Social History Narrative  . None   Past Surgical History  Procedure Date  . Colonoscopy 2003  . Percutaneous liver biopsy 2006    Dr Arlyce Dice  . Tonsillectomy    Past Medical History  Diagnosis  Date  . Hepatitis C 1990    [facility]   BP 120/78  Pulse 56  Resp 14  Ht 5\' 9"  (1.753 m)  Wt 178 lb (80.74 kg)  BMI 26.29 kg/m2  SpO2 98%      Review of Systems  Constitutional: Positive for unexpected weight change.  Respiratory: Positive for apnea.   Neurological: Positive for weakness.  All other systems reviewed and are negative.       Objective:   Physical Exam  Constitutional: He appears well-developed and well-nourished.  HENT:  Head: Normocephalic and atraumatic.  Right Ear: External ear normal.  Left Ear: External ear normal.  Eyes: Conjunctivae and EOM are normal. Pupils are equal, round, and reactive to light.  Neck: Normal range of motion.  Cardiovascular: Normal rate and regular rhythm.   Pulmonary/Chest: Effort normal. No respiratory distress. He has no wheezes.  Abdominal: He exhibits no distension. There is no tenderness.  Musculoskeletal:       Right knee: He exhibits bony tenderness and abnormal meniscus. He exhibits no swelling, no effusion, normal alignment and no LCL laxity.       Thoracic back: He exhibits decreased range of motion and tenderness.       Spasms in mid right thoracic paraspinals.    Able to bend  at waist and alomost touch toes.  Low back with mild tenderness on exam.  No antalga on right leg today.  Psychiatric: He has a normal mood and affect. His behavior is normal. Judgment and thought content normal.          Assessment & Plan:  1. 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 Overall he looks better. -encouraged use of a right knee sleeve and appopriate safety and strengthening of RLE. Encouraged water walking if available -use meloxicam on a prn basis, 7.5mg   -i don't think he requires narcotics for this pain -ice to knee  2. Left 3rd flexor tendon tear  -pain mgt, conservative care.   3. Low back pain. Appears to be mostly mechanical/muscular- he definitely has myofascial  pain in the mid right, thoracic paraspinals -given his finances, we will continue  To stay conservative  - robaxin 500 q6hr prn , heat and streching daily -meloxicam as above  -low back exercises will continue. Encouraged proper posture akso -i reviewed some scapular stretches with the patient also. 4. F/u with me in 3 months  -all questions were encouraged and answered.

## 2012-04-27 ENCOUNTER — Telehealth: Payer: Self-pay | Admitting: Physical Medicine & Rehabilitation

## 2012-04-27 ENCOUNTER — Other Ambulatory Visit: Payer: Self-pay | Admitting: Internal Medicine

## 2012-04-27 ENCOUNTER — Other Ambulatory Visit: Payer: Self-pay | Admitting: Family Medicine

## 2012-04-27 MED ORDER — CYCLOBENZAPRINE HCL 5 MG PO TABS: 5.0000 mg | ORAL_TABLET | Freq: Three times a day (TID) | ORAL | Status: AC | PRN

## 2012-04-27 NOTE — Telephone Encounter (Signed)
Is there another muscle relaxer we can give the patient? Thanks!

## 2012-04-27 NOTE — Telephone Encounter (Signed)
LM for pt letting him know that new rx has been sent to his pharmacy.

## 2012-04-27 NOTE — Telephone Encounter (Signed)
refill Hydrocodone/Acetaminophen 5-500MG  , last dispensed 7.11.12 Last ov 10.24.12

## 2012-04-27 NOTE — Telephone Encounter (Signed)
Flexeril 5mg  up to 3 times per day prn muscle cramps/muscle pain

## 2012-04-27 NOTE — Telephone Encounter (Signed)
methocarbaol is giving pt headaches can he get something different please call pt at [phone], the 2 meds tramadol and hydrocodone is working for pt

## 2012-04-27 NOTE — Telephone Encounter (Signed)
Pt would like to be put back on tramadol and or hydrocodone.  He is leary of taking any muscle relaxers due to the headaches robaxin gave him.  Please advise.

## 2012-04-27 NOTE — Telephone Encounter (Signed)
The medication that is giving him HA's is the  Methcardam(?) not the muscle relaxer.

## 2012-04-28 MED ORDER — TRAMADOL HCL 50 MG PO TABS: 50.0000 mg | ORAL_TABLET | Freq: Four times a day (QID) | ORAL | Status: AC | PRN

## 2012-04-28 NOTE — Telephone Encounter (Signed)
i re-ordered the tramadol

## 2012-04-28 NOTE — Telephone Encounter (Signed)
This prescription needs to come from the chronic pain clinic, Dr. Hermelinda Medicus

## 2012-04-28 NOTE — Telephone Encounter (Signed)
Left message on voicemail informing the pharmacy rx needs to be forwarded to Dr.Schwartz (Per Dr.Hopper)

## 2012-04-28 NOTE — Telephone Encounter (Signed)
Pt is aware.  

## 2012-04-28 NOTE — Telephone Encounter (Signed)
This patient is current seeing specialist. Dr.Hopper please advise if ok to rx medication

## 2012-07-14 ENCOUNTER — Encounter: Payer: Self-pay | Admitting: Physical Medicine and Rehabilitation

## 2012-07-21 ENCOUNTER — Encounter: Payer: Self-pay | Attending: Neurosurgery | Admitting: Physical Medicine & Rehabilitation

## 2012-07-21 DIAGNOSIS — B1710 Acute hepatitis C without hepatic coma: Secondary | ICD-10-CM | POA: Insufficient documentation

## 2012-07-21 DIAGNOSIS — M47817 Spondylosis without myelopathy or radiculopathy, lumbosacral region: Secondary | ICD-10-CM | POA: Insufficient documentation

## 2012-07-21 DIAGNOSIS — M1710 Unilateral primary osteoarthritis, unspecified knee: Secondary | ICD-10-CM | POA: Insufficient documentation

## 2012-07-21 DIAGNOSIS — M66339 Spontaneous rupture of flexor tendons, unspecified forearm: Secondary | ICD-10-CM | POA: Insufficient documentation

## 2012-07-21 DIAGNOSIS — IMO0002 Reserved for concepts with insufficient information to code with codable children: Secondary | ICD-10-CM | POA: Insufficient documentation

## 2012-07-24 ENCOUNTER — Encounter: Payer: Self-pay | Admitting: Internal Medicine

## 2012-07-24 ENCOUNTER — Ambulatory Visit (INDEPENDENT_AMBULATORY_CARE_PROVIDER_SITE_OTHER): Payer: Self-pay | Admitting: Internal Medicine

## 2012-07-24 VITALS — BP 140/84 | HR 55 | Temp 97.7°F | Wt 181.8 lb

## 2012-07-24 DIAGNOSIS — Z113 Encounter for screening for infections with a predominantly sexual mode of transmission: Secondary | ICD-10-CM

## 2012-07-24 DIAGNOSIS — B1710 Acute hepatitis C without hepatic coma: Secondary | ICD-10-CM

## 2012-07-24 LAB — HEPATIC FUNCTION PANEL
ALT: 14 U/L (ref 0–53)
AST: 23 U/L (ref 0–37)
Alkaline Phosphatase: 35 U/L — ABNORMAL LOW (ref 39–117)
Total Bilirubin: 1.1 mg/dL (ref 0.3–1.2)

## 2012-07-24 NOTE — Assessment & Plan Note (Signed)
Hepatic panel & Hep C titers will be checked

## 2012-07-24 NOTE — Patient Instructions (Addendum)
If you activate My Chart; the results can be released to you as soon as they populate from the lab. If you choose not to use this program; the labs have to be reviewed, copied & mailed   causing a delay in getting the results to you. 

## 2012-07-24 NOTE — Progress Notes (Signed)
  Subjective:    Patient ID: Noah [name], male    DOB: 04/20/1951, 61 y.o.   MRN: [id number]   HPI He has been receiving financial assistance through [facility] under the hepatitis C diagnosis. This eligibility will expire 08/16/12. Biopsy was performed 2006 by Dr. Arlyce Dice. He received 12 months of combined therapy for hepatitis C.  He is requesting HIV. He denies having unprotected sex  or using IV drugs.    Review of Systems He denies fever, chills, sweats, or weight loss. He also denies abdominal pain or change in color of urine or stool. Specifically there's been no Coke-colored urine or clay colored stool.  He is followed by Dr. Riley Kill for the pain/rehabilitation clinic and is on meloxicam and tramadol as his only prescription medications.     Objective:   Physical Exam General appearance is one of good health and nourishment w/o distress.  Eyes: No conjunctival inflammation or scleral icterus is present.  Oral exam: Dental hygiene is good; lips and gums are healthy appearing.There is no oropharyngeal erythema or exudate noted.   Heart:  Normal rate and regular rhythm. S1 and S2 normal without gallop, murmur, click, rub . S 4 with slight slurring    Lungs:Chest clear to auscultation; no wheezes, rhonchi,rales ,or rubs present.No increased work of breathing.   Abdomen: bowel sounds normal, soft and non-tender without masses, organomegaly or hernias noted.  No guarding or rebound   Skin:Warm & dry.  Intact without suspicious lesions or rashes ; no jaundice or tenting  Lymphatic: No lymphadenopathy is noted about the head, neck, axilla areas.              Assessment & Plan:

## 2012-07-25 LAB — HEPATITIS C RNA QUANTITATIVE

## 2012-07-26 ENCOUNTER — Encounter: Payer: Self-pay | Admitting: Physical Medicine & Rehabilitation

## 2012-07-26 ENCOUNTER — Encounter (HOSPITAL_BASED_OUTPATIENT_CLINIC_OR_DEPARTMENT_OTHER): Payer: Self-pay | Admitting: Physical Medicine & Rehabilitation

## 2012-07-26 ENCOUNTER — Telehealth: Payer: Self-pay

## 2012-07-26 VITALS — BP 158/96 | HR 58 | Resp 16 | Ht 69.5 in | Wt 180.0 lb

## 2012-07-26 DIAGNOSIS — S66819A Strain of other specified muscles, fascia and tendons at wrist and hand level, unspecified hand, initial encounter: Secondary | ICD-10-CM

## 2012-07-26 DIAGNOSIS — M47816 Spondylosis without myelopathy or radiculopathy, lumbar region: Secondary | ICD-10-CM

## 2012-07-26 DIAGNOSIS — M1711 Unilateral primary osteoarthritis, right knee: Secondary | ICD-10-CM

## 2012-07-26 DIAGNOSIS — M66339 Spontaneous rupture of flexor tendons, unspecified forearm: Secondary | ICD-10-CM

## 2012-07-26 DIAGNOSIS — M47817 Spondylosis without myelopathy or radiculopathy, lumbosacral region: Secondary | ICD-10-CM

## 2012-07-26 DIAGNOSIS — M1710 Unilateral primary osteoarthritis, unspecified knee: Secondary | ICD-10-CM

## 2012-07-26 MED ORDER — TRAMADOL HCL 50 MG PO TABS: 50.0000 mg | ORAL_TABLET | Freq: Three times a day (TID) | ORAL | Status: DC | PRN

## 2012-07-26 NOTE — Progress Notes (Signed)
Subjective:    Patient ID: Noah [name], male    DOB: [birth year], 61 y.o.   MRN: [id number]  HPI  Mr. Hubbert is back regarding his chronic right knee and low back pain. The meloxicam and ultram seem to be working. He knows his limitations but in general is doing fairly well. He tries to stay active as he can around the house and outside.   Pain Inventory Average Pain 7 Pain Right Now 7 My pain is intermittent  In the last 24 hours, has pain interfered with the following? General activity 7 Relation with others 7 Enjoyment of life 7 What TIME of day is your pain at its worst? morning Sleep (in general) Fair  Pain is worse with: walking, bending, sitting, inactivity, standing and some activites Pain improves with: rest and injections Relief from Meds: 3  Mobility walk without assistance do you drive?  yes  Function retired  Neuro/Psych No problems in this area  Prior Studies Any changes since last visit?  no  Physicians involved in your care Any changes since last visit?  no   Family History  Problem Relation Age of Onset  . Peripheral vascular disease Mother   . Alcohol abuse Father 26   History   Social History  . Marital Status: Divorced    Spouse Name: N/A    Number of Children: N/A  . Years of Education: N/A   Social History Main Topics  . Smoking status: Never Smoker   . Smokeless tobacco: Never Used  . Alcohol Use: Yes  . Drug Use: No  . Sexually Active: None   Other Topics Concern  . None   Social History Narrative  . None   Past Surgical History  Procedure Date  . Colonoscopy 2003  . Percutaneous liver biopsy 2006    Dr Arlyce Dice  . Tonsillectomy    Past Medical History  Diagnosis Date  . Hepatitis C 1990    [facility]   BP 158/96  Pulse 58  Resp 16  Ht 5' 9.5" (1.765 m)  Wt 180 lb (81.647 kg)  BMI 26.20 kg/m2  SpO2 100%      Review of Systems  Constitutional: Negative.   HENT: Negative.   Eyes: Negative.   Respiratory:  Positive for apnea and shortness of breath.   Cardiovascular: Negative.   Gastrointestinal: Negative.   Genitourinary: Negative.   Musculoskeletal: Positive for back pain.  Skin: Negative.   Neurological: Negative.   Hematological: Negative.   Psychiatric/Behavioral: Negative.        Objective:   Physical Exam Constitutional: He appears well-developed and well-nourished.  HENT:  Head: Normocephalic and atraumatic.  Right Ear: External ear normal.  Left Ear: External ear normal.  Eyes: Conjunctivae and EOM are normal. Pupils are equal, round, and reactive to light.  Neck: Normal range of motion.  Cardiovascular: Normal rate and regular rhythm.  Pulmonary/Chest: Effort normal. No respiratory distress. He has no wheezes.  Abdominal: He exhibits no distension. There is no tenderness.  Musculoskeletal:  Right knee: He exhibits bony tenderness and abnormal meniscus (medial meniscus sign/mcmurray). He exhibits no swelling, no effusion, normal alignment and no LCL laxity.  Thoracic back: He exhibits decreased range of motion and tenderness.  Spasms in mid right thoracic paraspinals.   Able to bend at waist and alomost touch toes.  Low back with mild tenderness on exam.  minimal antalga on right leg today.  Psychiatric: He has a normal mood and affect. His behavior is normal. Judgment  and thought content normal.    Assessment & Plan:   1. 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 Overall he looks better.  -encouraged use of a right knee sleeve and appopriate safety and strengthening of RLE. -use meloxicam on a prn basis, 7.5mg   -refilled tramadol prn  -ice to knee  2. Left 3rd flexor tendon tear  -pain mgt, conservative care.  3. Low back pain. Appears to be mostly mechanical/muscular-  -meloxicam as above  -low back exercises will continue. Encouraged proper posture akso  -i reviewed some scapular stretches with the patient also.    4. F/u with me in 6 months  -all questions were encouraged and answered.

## 2012-07-26 NOTE — Telephone Encounter (Signed)
Left message for patient to return call when available. Reason for call (not left on VM): give labs results and verify ok to mail lab results (we DO NOT mail lab results with sensitive information until the patient gives the ok to do so)

## 2012-07-26 NOTE — Patient Instructions (Signed)
Call me with questions 

## 2012-07-26 NOTE — Telephone Encounter (Signed)
Message copied by Maurice Small on Wed Jul 26, 2012 11:24 AM ------      Message from: Pecola Lawless      Created: Tue Jul 25, 2012  5:52 PM       There is minimal reduction in Alkaline Phosphatase.Dietary sources of  Alk Phos include whole grains & nuts. Alk Phos is important for optimal liver & bone health. HIV testing is negative .Hepatitis C titers are pending.

## 2012-07-27 NOTE — Progress Notes (Signed)
Left message on answering machine for patient to call back.  

## 2012-07-28 NOTE — Telephone Encounter (Signed)
Patient stopped by and picked up labs

## 2012-08-16 ENCOUNTER — Telehealth: Payer: Self-pay | Admitting: Physical Medicine & Rehabilitation

## 2012-08-16 NOTE — Telephone Encounter (Signed)
Patient walked in office requesting a refill on his melo

## 2012-08-22 ENCOUNTER — Telehealth: Payer: Self-pay

## 2012-08-22 NOTE — Telephone Encounter (Signed)
Spoke to pt concerning lab results. Mailing a copy to pt per pt request.    MW

## 2012-08-31 ENCOUNTER — Other Ambulatory Visit: Payer: Self-pay | Admitting: Physical Medicine & Rehabilitation

## 2012-09-28 ENCOUNTER — Other Ambulatory Visit: Payer: Self-pay | Admitting: Physical Medicine & Rehabilitation

## 2012-12-25 ENCOUNTER — Other Ambulatory Visit: Payer: Self-pay

## 2012-12-25 MED ORDER — TRAMADOL HCL 50 MG PO TABS: 50.0000 mg | ORAL_TABLET | Freq: Three times a day (TID) | ORAL | Status: DC | PRN

## 2012-12-27 ENCOUNTER — Other Ambulatory Visit: Payer: Self-pay | Admitting: Physical Medicine & Rehabilitation

## 2012-12-27 ENCOUNTER — Telehealth: Payer: Self-pay

## 2012-12-27 NOTE — Telephone Encounter (Signed)
Patient called for a refill on his meds.  He would like them sent to [facility] drug.  Did not say what med.

## 2012-12-27 NOTE — Telephone Encounter (Signed)
Left message for patient to clarify what medication he needed refilled.

## 2012-12-28 NOTE — Telephone Encounter (Signed)
meloxicam refilled.

## 2013-04-25 ENCOUNTER — Other Ambulatory Visit: Payer: Self-pay | Admitting: Physical Medicine & Rehabilitation

## 2013-05-01 ENCOUNTER — Encounter: Payer: Self-pay | Admitting: Physical Medicine & Rehabilitation

## 2013-05-01 ENCOUNTER — Encounter: Payer: Self-pay | Attending: Physical Medicine & Rehabilitation | Admitting: Physical Medicine & Rehabilitation

## 2013-05-01 VITALS — BP 105/86 | HR 55 | Resp 15 | Ht 69.0 in | Wt 181.0 lb

## 2013-05-01 DIAGNOSIS — M66339 Spontaneous rupture of flexor tendons, unspecified forearm: Secondary | ICD-10-CM

## 2013-05-01 DIAGNOSIS — M1710 Unilateral primary osteoarthritis, unspecified knee: Secondary | ICD-10-CM | POA: Insufficient documentation

## 2013-05-01 DIAGNOSIS — M545 Low back pain, unspecified: Secondary | ICD-10-CM | POA: Insufficient documentation

## 2013-05-01 DIAGNOSIS — M47816 Spondylosis without myelopathy or radiculopathy, lumbar region: Secondary | ICD-10-CM

## 2013-05-01 DIAGNOSIS — M6688 Spontaneous rupture of other tendons, other: Secondary | ICD-10-CM | POA: Insufficient documentation

## 2013-05-01 DIAGNOSIS — M66342 Spontaneous rupture of flexor tendons, left hand: Secondary | ICD-10-CM

## 2013-05-01 DIAGNOSIS — G8929 Other chronic pain: Secondary | ICD-10-CM | POA: Insufficient documentation

## 2013-05-01 DIAGNOSIS — IMO0002 Reserved for concepts with insufficient information to code with codable children: Secondary | ICD-10-CM

## 2013-05-01 DIAGNOSIS — M25569 Pain in unspecified knee: Secondary | ICD-10-CM | POA: Insufficient documentation

## 2013-05-01 DIAGNOSIS — M47817 Spondylosis without myelopathy or radiculopathy, lumbosacral region: Secondary | ICD-10-CM

## 2013-05-01 DIAGNOSIS — M1711 Unilateral primary osteoarthritis, right knee: Secondary | ICD-10-CM

## 2013-05-01 MED ORDER — MELOXICAM 7.5 MG PO TABS: 7.5000 mg | ORAL_TABLET | Freq: Every day | ORAL | Status: DC | PRN

## 2013-05-01 MED ORDER — TRAMADOL HCL 50 MG PO TABS: 50.0000 mg | ORAL_TABLET | Freq: Three times a day (TID) | ORAL | Status: DC | PRN

## 2013-05-01 NOTE — Patient Instructions (Signed)
CALL ME WITH ANY PROBLEMS OR QUESTIONS (#[phone]).  HAVE A GOOD DAY  

## 2013-05-01 NOTE — Progress Notes (Signed)
Subjective:    Patient ID: Noah [name], male    DOB: 11/13/1950, 61 y.o.   MRN: [id number]  HPI  Noah [name] is back regarding his chronic knee pain. He has been doing well with his pain regimen which generally has consisted of tramadol qd prn. He has been exercising and his legs have become stronger. Right knee is minimally swelling at this point. He is not using meloxicam at this time. He did not make his march appt due to cost considerations.     Pain Inventory Average Pain 7 Pain Right Now 7 My pain is other  In the last 24 hours, has pain interfered with the following? General activity 5 Relation with others 4 Enjoyment of life 5 What TIME of day is your pain at its worst? morning,night Sleep (in general) Poor  Pain is worse with: walking, bending, sitting, inactivity and standing Pain improves with: medication Relief from Meds: na  Mobility walk without assistance how many minutes can you walk? 10 ability to climb steps?  yes do you drive?  yes  Function retired I need assistance with the following:  household duties  Neuro/Psych bladder control problems bowel control problems weakness numbness tingling trouble walking spasms dizziness confusion depression anxiety  Prior Studies Any changes since last visit?  no  Physicians involved in your care Any changes since last visit?  no   Family History  Problem Relation Age of Onset  . Peripheral vascular disease Mother   . Alcohol abuse Father 58   History   Social History  . Marital Status: Divorced    Spouse Name: N/A    Number of Children: N/A  . Years of Education: N/A   Social History Main Topics  . Smoking status: Never Smoker   . Smokeless tobacco: Never Used  . Alcohol Use: Yes  . Drug Use: No  . Sexually Active: None   Other Topics Concern  . None   Social History Narrative  . None   Past Surgical History  Procedure Laterality Date  . Colonoscopy  2003  . Percutaneous  liver biopsy  2006    Dr Noah [name]  . Tonsillectomy     Past Medical History  Diagnosis Date  . Hepatitis C 1990    [facility]   BP 105/86  Pulse 55  Resp 15  Ht 5\' 9"  (1.753 m)  Wt 181 lb (82.101 kg)  BMI 26.72 kg/m2  SpO2 99%     Review of Systems  Constitutional: Positive for unexpected weight change.  Cardiovascular: Positive for leg swelling.  Gastrointestinal: Positive for constipation.  Musculoskeletal: Positive for gait problem.  Neurological: Positive for dizziness, weakness and numbness.       Tingling,spasms  Psychiatric/Behavioral: Positive for confusion, dysphoric mood and agitation.  All other systems reviewed and are negative.       Objective:   Physical Exam Constitutional: He appears well-developed and well-nourished.  HENT:  Head: Normocephalic and atraumatic.  Right Ear: External ear normal.  Left Ear: External ear normal.  Eyes: Conjunctivae and EOM are normal. Pupils are equal, round, and reactive to light.  Neck: Normal range of motion.  Cardiovascular: Normal rate and regular rhythm.  Pulmonary/Chest: Effort normal. No respiratory distress. He has no wheezes.  Abdominal: He exhibits no distension. There is no tenderness.  Musculoskeletal:  Right knee: He exhibits minimal tenderness at right knee. Meniscal maneuvers are negative. He exhibits no swelling, no effusion, normal alignment and no LCL laxity.  Thoracic back: He exhibits decreased  range of motion and tenderness.  Spasms in mid right thoracic paraspinals.   Able to bend at waist and alomost touch toes.  Low back with mild tenderness on exam.  no antalga on right leg today. Gait pattern normal. Psychiatric: He has a normal mood and affect. His behavior is normal. Judgment and thought content normal.   Assessment & Plan:   1. 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 This has remained stable and in fact has imprvoed -encouraged use of a right  knee sleeve and appopriate safety and strengthening of RLE per our prior discussions.  -use meloxicam on a prn basis, 7.5mg  ---refilled today -refilled tramadol prn  -ice to knee prn also 2. 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 Low back pain. Appears to be mostly mechanical/muscular-  -meloxicam as above  -low back exercises will continue. Encouraged proper posture akso  -i reviewed some scapular stretches with the patient also.  4. F/u with me in 6 months as long as we're filling tramadol. He can check with Dr. Alwyn [name] to see if he's willing to rx. -all questions were encouraged and answered.

## 2013-07-30 ENCOUNTER — Encounter: Payer: Self-pay | Admitting: Internal Medicine

## 2013-07-30 ENCOUNTER — Ambulatory Visit (INDEPENDENT_AMBULATORY_CARE_PROVIDER_SITE_OTHER): Payer: Self-pay | Admitting: Internal Medicine

## 2013-07-30 VITALS — BP 129/79 | HR 55 | Temp 97.7°F | Wt 171.6 lb

## 2013-07-30 DIAGNOSIS — M66339 Spontaneous rupture of flexor tendons, unspecified forearm: Secondary | ICD-10-CM

## 2013-07-30 DIAGNOSIS — M47816 Spondylosis without myelopathy or radiculopathy, lumbar region: Secondary | ICD-10-CM

## 2013-07-30 DIAGNOSIS — M1710 Unilateral primary osteoarthritis, unspecified knee: Secondary | ICD-10-CM

## 2013-07-30 DIAGNOSIS — M47817 Spondylosis without myelopathy or radiculopathy, lumbosacral region: Secondary | ICD-10-CM

## 2013-07-30 DIAGNOSIS — B1710 Acute hepatitis C without hepatic coma: Secondary | ICD-10-CM

## 2013-07-30 DIAGNOSIS — M1711 Unilateral primary osteoarthritis, right knee: Secondary | ICD-10-CM

## 2013-07-30 DIAGNOSIS — M66342 Spontaneous rupture of flexor tendons, left hand: Secondary | ICD-10-CM

## 2013-07-30 LAB — HEPATIC FUNCTION PANEL
Alkaline Phosphatase: 39 U/L (ref 39–117)
Bilirubin, Direct: 0.1 mg/dL (ref 0.0–0.3)
Total Bilirubin: 1.1 mg/dL (ref 0.3–1.2)
Total Protein: 7.1 g/dL (ref 6.0–8.3)

## 2013-07-30 MED ORDER — TRAMADOL HCL 50 MG PO TABS: 50.0000 mg | ORAL_TABLET | Freq: Three times a day (TID) | ORAL | Status: DC | PRN

## 2013-07-30 MED ORDER — MELOXICAM 7.5 MG PO TABS: 7.5000 mg | ORAL_TABLET | Freq: Every day | ORAL | Status: DC | PRN

## 2013-07-30 NOTE — Assessment & Plan Note (Signed)
LFTs 

## 2013-07-30 NOTE — Assessment & Plan Note (Signed)
See note re meds

## 2013-07-30 NOTE — Addendum Note (Signed)
Addended by: Silvio Pate D on: 07/30/2013 08:59 AM   Modules accepted: Orders

## 2013-07-30 NOTE — Progress Notes (Signed)
  Subjective:    Patient ID: Noah [name], male    DOB: 08-27-1951, 62 y.o.   MRN: [id number]  HPI  He's been seeing Dr. Riley Kill, Pain Management, for lumbar spondylosis and osteoarthritis the right knee. Remotely he had an injection in the right knee; no history was found in the surgical record concerning the nature of the procedure or date.  His symptoms when well controlled on meloxicam and ultrasound once daily in each. Dr. Riley Kill note 04/2013 was reviewed. Because of his stability Dr. Riley Kill felt that he could be followed here.  He also has a history of hepatitis C which was treated. 2006 he had a hepatic biopsy by Dr. Arlyce Dice. His last hepatic enzymes were 07/24/12. Alkaline phosphatase was mildly reduced at 35. AST and ALT were normal.     Review of Systems  He denies fever, chills, sweats,abdominal pain,coke-colored urine, or clay colored stool.      Objective:   Physical Exam Gen.: Healthy and well-nourished in appearance. Alert, appropriate and cooperative throughout exam. Eyes: No corneal or conjunctival inflammation noted. No icterus  Mouth: Oral mucosa and oropharynx reveal no lesions or exudates. Teeth in good repair. Neck: No deformities, masses, or tenderness noted. Range of motion decrreased. Thyroid normal. Lungs: Normal respiratory effort; chest expands symmetrically. Lungs are clear to auscultation without rales, wheezes, or increased work of breathing. Heart: Normal rate and rhythm. Normal S1 and S2. No gallop, click, or rub. S4 w/o murmur. Abdomen: Bowel sounds normal; abdomen soft and nontender. No masses, organomegaly or hernias noted.                             Musculoskeletal/extremities: No deformity or scoliosis noted of  the thoracic or lumbar spine.  No clubbing, cyanosis, edema, or significant extremity  deformity noted. Range of motion normal .Tone & strength  Normal. Joints normal . Mild crepitus R > L knee. Able to lie down & sit up w/o help. Negative  SLR bilaterally Vascular: Carotid, radial artery, dorsalis pedis and  posterior tibial pulses are full and equal. No bruits present. Neurologic: Alert and oriented x3. Deep tendon reflexes symmetrical and normal.         Skin: Intact without suspicious lesions or rashes. Lymph: No cervical, axillary lymphadenopathy present. Psych: Mood and affect are normal. Normally interactive                                                                                        Assessment & Plan:  See Current Assessment & Plan in Problem List under specific Diagnosis

## 2013-07-30 NOTE — Assessment & Plan Note (Signed)
I shall assume Rx of meds as on only 1 of each daily.

## 2013-07-30 NOTE — Patient Instructions (Addendum)
Share results with all non [facility] medical staff seen  Use an anti-inflammatory cream such as Aspercreme or Zostrix cream twice a day to the  knee as needed. In lieu of this warm moist compresses or  hot water bottle can be used. Do not apply ice to the knees.

## 2013-08-06 ENCOUNTER — Telehealth: Payer: Self-pay | Admitting: *Deleted

## 2013-08-06 NOTE — Telephone Encounter (Signed)
Patient called and requested that a copy of recent labs and phone number to billing. Information given.

## 2013-09-06 ENCOUNTER — Other Ambulatory Visit: Payer: Self-pay | Admitting: Physical Medicine & Rehabilitation

## 2013-09-07 ENCOUNTER — Telehealth: Payer: Self-pay

## 2013-09-07 NOTE — Telephone Encounter (Signed)
Called pharmacy to cancel Tramadol refill because tramadol was prescribed by another MD.

## 2013-09-08 ENCOUNTER — Emergency Department (HOSPITAL_BASED_OUTPATIENT_CLINIC_OR_DEPARTMENT_OTHER)
Admission: EM | Admit: 2013-09-08 | Discharge: 2013-09-08 | Disposition: A | Discharge status: Home / Self Care | Payer: Medicaid Other | Attending: Emergency Medicine | Admitting: Emergency Medicine

## 2013-09-08 ENCOUNTER — Encounter (HOSPITAL_BASED_OUTPATIENT_CLINIC_OR_DEPARTMENT_OTHER): Payer: Self-pay | Admitting: Emergency Medicine

## 2013-09-08 ENCOUNTER — Emergency Department (HOSPITAL_BASED_OUTPATIENT_CLINIC_OR_DEPARTMENT_OTHER): Payer: Medicaid Other

## 2013-09-08 DIAGNOSIS — M25552 Pain in left hip: Secondary | ICD-10-CM

## 2013-09-08 DIAGNOSIS — Z8619 Personal history of other infectious and parasitic diseases: Secondary | ICD-10-CM | POA: Insufficient documentation

## 2013-09-08 DIAGNOSIS — M25559 Pain in unspecified hip: Secondary | ICD-10-CM | POA: Insufficient documentation

## 2013-09-08 DIAGNOSIS — Z791 Long term (current) use of non-steroidal anti-inflammatories (NSAID): Secondary | ICD-10-CM | POA: Insufficient documentation

## 2013-09-08 MED ORDER — HYDROCODONE-ACETAMINOPHEN 5-325 MG PO TABS: 2.0000 | ORAL_TABLET | ORAL | Status: DC | PRN

## 2013-09-08 NOTE — ED Notes (Signed)
Patient here with left hip pain x 1 week, unknown injury. Patient reports that the pain radiates down to knee. Pain worse with ambuulation and change in position

## 2013-09-08 NOTE — ED Provider Notes (Signed)
CSN: [id number]     Arrival date & time 09/08/13  1228 History   First MD Initiated Contact with Patient 09/08/13 1301     Chief Complaint  Patient presents with  . Hip Pain   (Consider location/radiation/quality/duration/timing/severity/associated sxs/prior Treatment) Patient is a 63 y.o. male presenting with hip pain. The history is provided by the patient. No language interpreter was used.  Hip Pain This is a new problem. The current episode started today. The problem occurs constantly. The problem has been unchanged. Associated symptoms include arthralgias and myalgias. Nothing aggravates the symptoms. He has tried nothing for the symptoms. The treatment provided moderate relief.   Pt reports he has had pain in his hip for the past week.  Pt denies any injuries.  Pt reports he is very athletic and works out a lot.  Past Medical History  Diagnosis Date  . Hepatitis C 1990    [facility]   Past Surgical History  Procedure Laterality Date  . Colonoscopy  2003  . Percutaneous liver biopsy  2006    Dr Arlyce Dice  . Tonsillectomy     Family History  Problem Relation Age of Onset  . Peripheral vascular disease Mother   . Alcohol abuse Father 94   History  Substance Use Topics  . Smoking status: Never Smoker   . Smokeless tobacco: Never Used  . Alcohol Use: Yes    Review of Systems  Musculoskeletal: Positive for arthralgias and myalgias.  All other systems reviewed and are negative.    Allergies  Review of patient's allergies indicates no known allergies.  Home Medications   Current Outpatient Rx  Name  Route  Sig  Dispense  Refill  . meloxicam (MOBIC) 7.5 MG tablet   Oral   Take 7.5 mg by mouth daily.         . traMADol (ULTRAM) 50 MG tablet   Oral   Take 1 tablet (50 mg total) by mouth every 8 (eight) hours as needed for pain.   45 tablet   2    BP 149/95  Temp(Src) 97.6 F (36.4 C) (Oral)  Resp 14  Ht 5\' 9"  (1.753 m)  Wt 180 lb (81.647 kg)  BMI 26.57 kg/m2   SpO2 100% Physical Exam  Nursing note and vitals reviewed. HENT:  Head: Normocephalic and atraumatic.  Eyes: Pupils are equal, round, and reactive to light.  Neck: Normal range of motion.  Cardiovascular: Normal rate and regular rhythm.   Pulmonary/Chest: Effort normal and breath sounds normal.  Abdominal: Soft.  Musculoskeletal: He exhibits tenderness.  Neurological: He is alert.  Skin: Skin is warm.  Psychiatric: He has a normal mood and affect.    ED Course  Procedures (including critical care time) Labs Review Labs Reviewed - No data to display Imaging Review No results found.  EKG Interpretation   None       MDM   1. Hip pain, left    Pt advised to follow up with Dr. Pearletha Forge.   Pt given rx for hydrocodone.    Lonia Skinner [place], PA-C 09/08/13 1712

## 2013-09-09 NOTE — ED Provider Notes (Signed)
Medical screening examination/treatment/procedure(s) were performed by non-physician practitioner and as supervising physician I was immediately available for consultation/collaboration.  EKG Interpretation   None         Kimela Malstrom S Ellyson Rarick, MD 09/09/13 1055 

## 2013-09-14 ENCOUNTER — Other Ambulatory Visit: Payer: Self-pay | Admitting: Physical Medicine & Rehabilitation

## 2013-09-19 ENCOUNTER — Telehealth: Payer: Self-pay | Admitting: Internal Medicine

## 2013-09-19 NOTE — Telephone Encounter (Signed)
Patient called and requested a refill for traMADol (ULTRAM) 50 MG tablet   Pharmacy [id number] - [place], [place] - [address] AT [facility] [address]

## 2013-09-21 ENCOUNTER — Other Ambulatory Visit: Payer: Self-pay | Admitting: *Deleted

## 2013-09-21 ENCOUNTER — Telehealth: Payer: Self-pay | Admitting: *Deleted

## 2013-09-21 ENCOUNTER — Encounter: Payer: Self-pay | Admitting: *Deleted

## 2013-09-21 DIAGNOSIS — M1711 Unilateral primary osteoarthritis, right knee: Secondary | ICD-10-CM

## 2013-09-21 DIAGNOSIS — M47816 Spondylosis without myelopathy or radiculopathy, lumbar region: Secondary | ICD-10-CM

## 2013-09-21 DIAGNOSIS — M66342 Spontaneous rupture of flexor tendons, left hand: Secondary | ICD-10-CM

## 2013-09-21 MED ORDER — TRAMADOL HCL 50 MG PO TABS: 50.0000 mg | ORAL_TABLET | Freq: Three times a day (TID) | ORAL | Status: DC | PRN

## 2013-09-21 NOTE — Telephone Encounter (Signed)
Please call #15 to last for the weekend, next rf  by PCP

## 2013-09-21 NOTE — Telephone Encounter (Signed)
traMADol (ULTRAM) 50 MG tablet Last OV: 07/30/13 Last refill: 07/30/13 #45, 2 refills No contract on file.

## 2013-09-21 NOTE — Telephone Encounter (Signed)
09/21/2013  Pt came by office to find out status of refill request from 09/19/2013.  Addressed with Amado Coe.  She is sending to [facility] for authorization.  Pt requested we call him when taken care of.  [phone]  bw

## 2013-09-21 NOTE — Telephone Encounter (Signed)
Tramadol refilled #15, 0 refills. Pt aware

## 2013-09-26 NOTE — Telephone Encounter (Signed)
Rx was filled on (09-21-13).//AB/CMA

## 2013-10-04 ENCOUNTER — Encounter: Payer: Self-pay | Admitting: Internal Medicine

## 2013-10-08 ENCOUNTER — Telehealth: Payer: Self-pay | Admitting: *Deleted

## 2013-10-08 NOTE — Telephone Encounter (Signed)
AST,ALT, BUN , creat need monitoring every 6 mos (PMH hrepatitis C) if he continues Tramadol chronically. If he requests hydrocodone he'll need to [facility]        Last hepatic panel on 07/30/13, which was also last OV with Dr. Alwyn Ren.

## 2013-10-15 ENCOUNTER — Other Ambulatory Visit: Payer: Self-pay | Admitting: Internal Medicine

## 2013-10-16 ENCOUNTER — Telehealth: Payer: Self-pay | Admitting: *Deleted

## 2013-10-16 NOTE — Telephone Encounter (Signed)
rx request- tramadol 50mg  Last ov- 08/01/13  #15 / 0 rf  Last refilled 09/21/13 [facility] contract - 09/30/13  Please advise. DJR

## 2013-10-16 NOTE — Telephone Encounter (Signed)
Dr Frederik Pear pt

## 2013-10-16 NOTE — Telephone Encounter (Signed)
rx request- tramadol 50mg Last ov- 08/01/13  #15 / 0 rf  Last refilled 09/21/13 [facility] contract - 09/30/13  Please advise. DJR  

## 2013-10-17 ENCOUNTER — Other Ambulatory Visit: Payer: Self-pay | Admitting: *Deleted

## 2013-10-17 DIAGNOSIS — M47816 Spondylosis without myelopathy or radiculopathy, lumbar region: Secondary | ICD-10-CM

## 2013-10-17 DIAGNOSIS — M1711 Unilateral primary osteoarthritis, right knee: Secondary | ICD-10-CM

## 2013-10-17 DIAGNOSIS — M66342 Spontaneous rupture of flexor tendons, left hand: Secondary | ICD-10-CM

## 2013-10-17 MED ORDER — TRAMADOL HCL 50 MG PO TABS: 50.0000 mg | ORAL_TABLET | Freq: Three times a day (TID) | ORAL | Status: DC | PRN

## 2013-10-17 NOTE — Telephone Encounter (Signed)
OK to refill @ prior # pills (not 15)

## 2013-10-17 NOTE — Telephone Encounter (Signed)
Tramadol refilled per protocol

## 2013-10-17 NOTE — Telephone Encounter (Signed)
Tramadol refilled #45, 1 refills. JG//CMA

## 2013-12-06 ENCOUNTER — Encounter: Payer: Self-pay | Admitting: Internal Medicine

## 2013-12-06 ENCOUNTER — Ambulatory Visit (INDEPENDENT_AMBULATORY_CARE_PROVIDER_SITE_OTHER): Payer: No Typology Code available for payment source | Admitting: Internal Medicine

## 2013-12-06 VITALS — BP 123/82 | HR 64 | Temp 97.8°F | Wt 189.0 lb

## 2013-12-06 DIAGNOSIS — Z23 Encounter for immunization: Secondary | ICD-10-CM

## 2013-12-06 DIAGNOSIS — M47817 Spondylosis without myelopathy or radiculopathy, lumbosacral region: Secondary | ICD-10-CM

## 2013-12-06 DIAGNOSIS — M47816 Spondylosis without myelopathy or radiculopathy, lumbar region: Secondary | ICD-10-CM

## 2013-12-06 MED ORDER — MELOXICAM 7.5 MG PO TABS: 7.5000 mg | ORAL_TABLET | Freq: Every day | ORAL | Status: DC

## 2013-12-06 MED ORDER — HYDROCODONE-ACETAMINOPHEN 5-325 MG PO TABS: 0.5000 | ORAL_TABLET | Freq: Every day | ORAL | Status: DC

## 2013-12-06 NOTE — Progress Notes (Signed)
Pre visit review using our clinic review tool, if applicable. No additional management support is needed unless otherwise documented below in the visit note. 

## 2013-12-06 NOTE — Patient Instructions (Signed)
See PCP in 3 months.

## 2013-12-06 NOTE — Assessment & Plan Note (Addendum)
Chronic back pain with radicular features Symptoms relatively well controlled on the following regimen: Tramadol-one tablet in the morning Hydrocodone- half tablet in the afternoon Meloxicam one a day. Plan:  Request a refill hydrocodone which is provided Refer to ortho b/c pain is persistent, ?MRI Had a UDS--- will get results

## 2013-12-06 NOTE — Progress Notes (Signed)
   Subjective:    Patient ID: Noah [name], male    DOB: [birth year], 63 y.o.   MRN: [id number]  HPI Here to discuss back pain. Approximately 2 years history of back pain, radiates from the left buttock down to the ankle traveling the posterior aspect of the leg. Symptoms increase with walking. Currently taking ultram, hydrocodone and meloxicam, it is controlling the symptoms but the pain is not going away.  Past Medical History  Diagnosis Date  . [facility] C 1990    [facility]   Past Surgical History  Procedure Laterality Date  . Colonoscopy  2003  . Percutaneous liver biopsy  2006    Dr Arlyce DiceKaplan  . Tonsillectomy     History   Social History  . Marital Status: Divorced    Spouse Name: N/A    Number of Children: N/A  . Years of Education: N/A   Occupational History  . retired    Social History Main Topics  . Smoking status: Never Smoker   . Smokeless tobacco: Never Used  . Alcohol Use: Yes  . Drug Use: No  . Sexual Activity: Not on file   Other Topics Concern  . Not on file   Social History Narrative  . No narrative on file    Review of Systems Denies nausea, vomiting, blood in the stools or abdominal pain. Tolerating meloxicam well. No lower extremity paresthesias.     Objective:   Physical Exam  BP 123/82  Pulse 64  Temp(Src) 97.8 F (36.6 C)  Wt 189 lb (85.73 kg)  SpO2 98% General -- alert, well-developed, NAD.  Extremities-- no pretibial edema bilaterally  Neurologic--  alert & oriented X3. Speech normal, gait normal, strength normal in all extremities.  DTRs LE symmetric (absent ankle jerk B) Psych-- Cognition and judgment appear intact. Cooperative with normal attention span and concentration. No anxious or depressed appearing.      Assessment & Plan:

## 2013-12-07 ENCOUNTER — Encounter: Payer: Self-pay | Admitting: Internal Medicine

## 2013-12-09 ENCOUNTER — Telehealth: Payer: Self-pay | Admitting: Internal Medicine

## 2013-12-09 NOTE — Telephone Encounter (Signed)
Advise patient, we don't have UDS on file, please come at his earliest convenience to give us a sample.

## 2013-12-10 ENCOUNTER — Telehealth: Payer: Self-pay | Admitting: Lab

## 2013-12-12 NOTE — Telephone Encounter (Signed)
Pt.notified

## 2013-12-27 ENCOUNTER — Telehealth: Payer: Self-pay

## 2013-12-27 ENCOUNTER — Other Ambulatory Visit: Payer: Self-pay | Admitting: Internal Medicine

## 2013-12-27 DIAGNOSIS — M1711 Unilateral primary osteoarthritis, right knee: Secondary | ICD-10-CM

## 2013-12-27 DIAGNOSIS — G894 Chronic pain syndrome: Secondary | ICD-10-CM

## 2013-12-27 NOTE — Telephone Encounter (Signed)
The patient is hoping to get a referral to a pain management clinic.  He states he is running low on his pain medication, and he is still in a great deal of pain.    Callback - [phone]

## 2013-12-27 NOTE — Telephone Encounter (Signed)
Referral made 

## 2013-12-28 ENCOUNTER — Ambulatory Visit (INDEPENDENT_AMBULATORY_CARE_PROVIDER_SITE_OTHER): Payer: No Typology Code available for payment source | Admitting: Internal Medicine

## 2013-12-28 ENCOUNTER — Encounter: Payer: Self-pay | Admitting: Internal Medicine

## 2013-12-28 VITALS — BP 104/70 | HR 60 | Temp 98.0°F | Wt 188.0 lb

## 2013-12-28 DIAGNOSIS — M47816 Spondylosis without myelopathy or radiculopathy, lumbar region: Secondary | ICD-10-CM

## 2013-12-28 DIAGNOSIS — M47817 Spondylosis without myelopathy or radiculopathy, lumbosacral region: Secondary | ICD-10-CM

## 2013-12-28 MED ORDER — HYDROCODONE-ACETAMINOPHEN 5-325 MG PO TABS: 0.5000 | ORAL_TABLET | Freq: Two times a day (BID) | ORAL | Status: DC | PRN

## 2013-12-28 NOTE — Progress Notes (Signed)
   Subjective:    Patient ID: Noah [name], male    DOB: 1950-12-07, 63 y.o.   MRN: [id number]  DOS:  12/28/2013 Acute visit,  Continue with back pain, symptoms unchanged. He has decreasing amount of medication she takes, see assessment and plan. Has multiple questions about the pain, related to blood clots?      Past Medical History  Diagnosis Date  . [facility] C [facility]  . History of hepatitis C 04/15/2009    Qualifier: Diagnosis of  By: Alwyn RenHopper MD, Chrissie NoaWilliam  S/P pegylated interferon & ribavirin for 12 months.S/P liver biopsy 2006, Dr Melvia Heapsobert  Kaplan, GI. ? [place] 63; diagnosed age 89 @ [facility]     Past Surgical History  Procedure Laterality Date  . Colonoscopy  2003  . Percutaneous liver biopsy  2006    Dr Arlyce DiceKaplan  . Tonsillectomy      History   Social History  . Marital Status: Divorced    Spouse Name: N/A    Number of Children: N/A  . Years of Education: N/A   Occupational History  . retired    Social History Main Topics  . Smoking status: Never Smoker   . Smokeless tobacco: Never Used  . Alcohol Use: Yes  . Drug Use: No  . Sexual Activity: Not on file   Other Topics Concern  . Not on file   Social History Narrative  . No narrative on file        Medication List       This list is accurate as of: 12/28/13 11:59 PM.  Always use your most recent med list.               HYDROcodone-acetaminophen 5-325 MG per tablet  Commonly known as:  NORCO/VICODIN  Take 0.5-1 tablets by mouth 2 (two) times daily as needed.           Objective:   Physical Exam BP 104/70  Pulse 60  Temp(Src) 98 F (36.7 C)  Wt 188 lb (85.276 kg)  SpO2 98% General -- alert, well-developed, NAD.    Neurologic--  alert & oriented X3. Speech normal, gait normal. Psych-- Cognition and judgment appear intact. Cooperative with normal attention span and concentration. No anxious or depressed appearing.      Assessment & Plan:   Today , I spent more  than 15 min with the patient, >50% of the time counseling, and coordinating his care

## 2013-12-28 NOTE — Progress Notes (Signed)
Pre visit review using our clinic review tool, if applicable. No additional management support is needed unless otherwise documented below in the visit note. 

## 2013-12-28 NOTE — Patient Instructions (Signed)
If you don't hear from the referral to see the orthopedic doctor within the week, please call the office and talk with [place]

## 2013-12-28 NOTE — Assessment & Plan Note (Addendum)
Back pain for several months, he was refer to orthopedic surgery and subsequently to pain management, has not been seen by the specialist yet. Today I spent all the the time coordinating his care and educate the patient about his symptoms. I do think he needs to see orthopedic surgery And I discussed it with our scheduler,  [facility] consequently will refer him to  [place]tho @ [place] (has been dismissed from  sports medicine doctors). His symptoms are not consistent with a blood clot (DVT) , the pain radiates from the low back to the posterior L leg thus most likely he has a back problem possibly a radiculopathy. He discontinued Ultram, meloxicam, currently on hydrocodone one tablet twice a day, that works better for him. He did have a UDS 09-2013

## 2013-12-30 ENCOUNTER — Encounter: Payer: Self-pay | Admitting: Internal Medicine

## 2013-12-31 ENCOUNTER — Ambulatory Visit: Payer: Self-pay | Admitting: Family Medicine

## 2014-01-01 ENCOUNTER — Encounter: Payer: Self-pay | Admitting: Orthopedic Surgery

## 2014-01-04 ENCOUNTER — Telehealth: Payer: Self-pay | Admitting: Orthopedic Surgery

## 2014-01-04 NOTE — Telephone Encounter (Signed)
Call received and referral received from [facility], at office of Dr Drue NovelPaz, [facility], for request for appointment for patient's main complaint of right knee osteo-arthritis; patient has 100% [facility] discount effective through 01/30/14.  I have left a voice message for patient that we will call patient back with additional information.  Please review and advise regarding scheduling. Their office # is [phone].  Patient ph# is [phone].

## 2014-01-11 ENCOUNTER — Encounter: Payer: Self-pay | Admitting: Orthopedic Surgery

## 2014-01-24 ENCOUNTER — Encounter: Payer: Self-pay | Admitting: Orthopedic Surgery

## 2014-01-24 ENCOUNTER — Ambulatory Visit (INDEPENDENT_AMBULATORY_CARE_PROVIDER_SITE_OTHER): Payer: No Typology Code available for payment source | Admitting: Orthopedic Surgery

## 2014-01-24 VITALS — BP 137/83 | Ht 69.0 in | Wt 183.0 lb

## 2014-01-24 DIAGNOSIS — IMO0002 Reserved for concepts with insufficient information to code with codable children: Secondary | ICD-10-CM

## 2014-01-24 DIAGNOSIS — M5410 Radiculopathy, site unspecified: Secondary | ICD-10-CM

## 2014-01-24 NOTE — Progress Notes (Signed)
Patient ID: Noah [name], male   DOB: 07-02-1951, 63 y.o.   MRN: [id number]  Chief Complaint  Patient presents with  . Back Pain    Back pain. Second opinion for consult.    HISTORY:  This is a 63 year old male with long history of various orthopedic related aches and pains back and knee. He was dismissed from one clinic for reasons unclear to me at this time. They seem to be related to hydrocodone and pain management but in any event he's had back pain now for several months to review her has not not had any physical therapy he's been on some hydrocodone and anti-inflammatories but continues to have sharp 7/10 pain radiating down his left leg with tingling which is worse with walking standing and sitting  He denies any bowel or bladder dysfunction he's had some morning chest pain heartburn tingling in his legs on the left bordering of the eyes weight gain his other systems were reviewed were normal  His past family social history and review of systems are recorded he is divorced he does not smoke he drinks on the weekends he completed his high school education  BP 137/83  Ht 5\' 9"  (1.753 m)  Wt 183 lb (83.008 kg)  BMI 27.01 kg/m2   General the patient is well-developed and well-nourished grooming and hygiene are normal Oriented x3 Mood and affect normal Ambulation normal  Inspection of the lumbar spine did not reveal any tenderness, thoracic and cervical spine were also nontender. He does note that he has to place a role in his back when he sitting or driving for long period of time  The lower extremities were examined and revealed the following: Full range of motion All joints are stable Motor exam is normal Skin clean dry and intact  Cardiovascular exam is normal Sensory exam normal, reflexes were normal straight leg raises were normal  At this point the best thing to do is to get an MRI of his back I suspect he'll have some degenerative disc changes without surgical  herniation. I will call him with his results  No followup scheduled

## 2014-01-24 NOTE — Patient Instructions (Signed)
MRI Ordered. 

## 2014-01-30 ENCOUNTER — Ambulatory Visit (HOSPITAL_COMMUNITY)
Admission: RE | Admit: 2014-01-30 | Discharge: 2014-01-30 | Disposition: A | Discharge status: Home / Self Care | Payer: Medicaid Other | Source: Ambulatory Visit | Attending: Orthopedic Surgery | Admitting: Orthopedic Surgery

## 2014-01-30 DIAGNOSIS — M79609 Pain in unspecified limb: Secondary | ICD-10-CM | POA: Insufficient documentation

## 2014-01-30 DIAGNOSIS — M5410 Radiculopathy, site unspecified: Secondary | ICD-10-CM

## 2014-01-30 DIAGNOSIS — M545 Low back pain, unspecified: Secondary | ICD-10-CM | POA: Insufficient documentation

## 2014-01-30 DIAGNOSIS — M5126 Other intervertebral disc displacement, lumbar region: Secondary | ICD-10-CM | POA: Insufficient documentation

## 2014-01-30 DIAGNOSIS — IMO0002 Reserved for concepts with insufficient information to code with codable children: Secondary | ICD-10-CM

## 2014-01-30 DIAGNOSIS — M48061 Spinal stenosis, lumbar region without neurogenic claudication: Secondary | ICD-10-CM | POA: Insufficient documentation

## 2014-02-01 ENCOUNTER — Telehealth: Payer: Self-pay | Admitting: Orthopedic Surgery

## 2014-02-01 NOTE — Telephone Encounter (Signed)
Tried to relay mri results   Herniated disc   Refer to nsurgery

## 2014-02-04 ENCOUNTER — Telehealth: Payer: Self-pay | Admitting: *Deleted

## 2014-02-04 NOTE — Telephone Encounter (Signed)
I tried to make referral to neurosurgeon, however, patient only has 100% [facility] discount, and [facility] does not accept that. Patient was [facility]. He wants to hold off on the referral for right now, due to he does not have the funds to go.

## 2014-02-20 ENCOUNTER — Encounter: Payer: Self-pay | Admitting: Internal Medicine

## 2014-02-20 ENCOUNTER — Ambulatory Visit (INDEPENDENT_AMBULATORY_CARE_PROVIDER_SITE_OTHER): Payer: No Typology Code available for payment source | Admitting: Internal Medicine

## 2014-02-20 VITALS — BP 132/79 | HR 60 | Temp 97.9°F | Wt 187.0 lb

## 2014-02-20 DIAGNOSIS — M47816 Spondylosis without myelopathy or radiculopathy, lumbar region: Secondary | ICD-10-CM

## 2014-02-20 DIAGNOSIS — M47817 Spondylosis without myelopathy or radiculopathy, lumbosacral region: Secondary | ICD-10-CM

## 2014-02-20 MED ORDER — HYDROCODONE-ACETAMINOPHEN 5-325 MG PO TABS: 0.5000 | ORAL_TABLET | Freq: Two times a day (BID) | ORAL | Status: DC | PRN

## 2014-02-20 MED ORDER — PREDNISONE 10 MG PO TABS: ORAL_TABLET | ORAL | Status: DC

## 2014-02-20 NOTE — Assessment & Plan Note (Addendum)
Since the last time he was here, a MRI was done, see results above, he has an appointment to see neurosurgery (patient working on getting Medicaid or Social Security before he sees the neurosurgeon) Symptoms are relatively well control with hydrocodone half tablet twice a day. Plan: Prednisone for a few days trying to get the pain even better Refill hydrocodone UDS on return to the office

## 2014-02-20 NOTE — Progress Notes (Signed)
   Subjective:    Patient ID: Noah [name], male    DOB: Jan 01, 1951, 63 y.o.   MRN: [id number]  DOS:  02/20/2014 Type of  visit: followup visit Saw the specialist, MRI was performed, see results below. He was referred to neurosurgery. Symptoms: Currently relatively well control with hydrocodone half tablet twice a day  MRI 01-2014 1. Focal central disc protrusion at L2-3.  2. Mild to moderate spinal and moderate bilateral lateral recess  stenosis at L3-4. There is also mild left greater than right  foraminal stenosis.  3. Mild right foraminal stenosis at L4-5 due to a shallow  broad-based disc protrusion and spurring.  4. Moderate to large central disc protrusion at L5-S1. There is also  a right foraminal component contributing to mild right foraminal  stenosis.    ROS Denies feeling excessively sleepy with medication. No bladder or bowel incontinence.   Past Medical History  Diagnosis Date  . [facility]  . History of hepatitis C 04/15/2009    Qualifier: Diagnosis of  By: Alwyn RenHopper MD, Chrissie NoaWilliam  S/P pegylated interferon & ribavirin for 12 months.S/P liver biopsy 2006, Dr Melvia Heapsobert  Kaplan, GI. ? [place] 63; diagnosed age 63 @ [facility]     Past Surgical History  Procedure Laterality Date  . Colonoscopy  2003  . Percutaneous liver biopsy  2006    Dr Arlyce DiceKaplan  . Tonsillectomy      History   Social History  . Marital Status: Divorced    Spouse Name: N/A    Number of Children: N/A  . Years of Education: N/A   Occupational History  . retired    Social History Main Topics  . Smoking status: Never Smoker   . Smokeless tobacco: Never Used  . Alcohol Use: Yes  . Drug Use: No  . Sexual Activity: Not on file   Other Topics Concern  . Not on file   Social History Narrative  . No narrative on file        Medication List       This list is accurate as of: 02/20/14  6:30 PM.  Always use your most recent med list.               HYDROcodone-acetaminophen 5-325 MG per tablet  Commonly known as:  NORCO/VICODIN  Take 0.5-1 tablets by mouth 2 (two) times daily as needed.     predniSONE 10 MG tablet  Commonly known as:  DELTASONE  4 tablets x 2 days, 3 tabs x 2 days, 2 tabs x 2 days, 1 tab x 2 days           Objective:   Physical Exam BP 132/79  Pulse 60  Temp(Src) 97.9 F (36.6 C)  Wt 187 lb (84.823 kg)  SpO2 100%  General -- alert, well-developed, NAD.   Psych-- Cognition and judgment appear intact. Cooperative with normal attention span and concentration. No anxious or depressed appearing.       Assessment & Plan:

## 2014-02-20 NOTE — Patient Instructions (Signed)
Take medicines as prescribed Next visit in 4 months

## 2014-02-20 NOTE — Progress Notes (Signed)
Pre visit review using our clinic review tool, if applicable. No additional management support is needed unless otherwise documented below in the visit note. 

## 2014-03-04 ENCOUNTER — Emergency Department (HOSPITAL_BASED_OUTPATIENT_CLINIC_OR_DEPARTMENT_OTHER): Payer: Medicaid Other

## 2014-03-04 ENCOUNTER — Emergency Department (HOSPITAL_BASED_OUTPATIENT_CLINIC_OR_DEPARTMENT_OTHER)
Admission: EM | Admit: 2014-03-04 | Discharge: 2014-03-04 | Disposition: A | Discharge status: Home / Self Care | Payer: Medicaid Other | Attending: Emergency Medicine | Admitting: Emergency Medicine

## 2014-03-04 ENCOUNTER — Encounter (HOSPITAL_BASED_OUTPATIENT_CLINIC_OR_DEPARTMENT_OTHER): Payer: Self-pay | Admitting: Emergency Medicine

## 2014-03-04 DIAGNOSIS — Z8619 Personal history of other infectious and parasitic diseases: Secondary | ICD-10-CM | POA: Insufficient documentation

## 2014-03-04 DIAGNOSIS — Y9389 Activity, other specified: Secondary | ICD-10-CM | POA: Insufficient documentation

## 2014-03-04 DIAGNOSIS — S6990XA Unspecified injury of unspecified wrist, hand and finger(s), initial encounter: Secondary | ICD-10-CM | POA: Diagnosis present

## 2014-03-04 DIAGNOSIS — Y929 Unspecified place or not applicable: Secondary | ICD-10-CM | POA: Insufficient documentation

## 2014-03-04 DIAGNOSIS — M7020 Olecranon bursitis, unspecified elbow: Secondary | ICD-10-CM | POA: Diagnosis not present

## 2014-03-04 DIAGNOSIS — S5000XA Contusion of unspecified elbow, initial encounter: Secondary | ICD-10-CM | POA: Diagnosis not present

## 2014-03-04 DIAGNOSIS — S59909A Unspecified injury of unspecified elbow, initial encounter: Secondary | ICD-10-CM | POA: Diagnosis present

## 2014-03-04 DIAGNOSIS — W1809XA Striking against other object with subsequent fall, initial encounter: Secondary | ICD-10-CM | POA: Diagnosis not present

## 2014-03-04 DIAGNOSIS — M7021 Olecranon bursitis, right elbow: Secondary | ICD-10-CM

## 2014-03-04 MED ORDER — HYDROCODONE-ACETAMINOPHEN 5-325 MG PO TABS: 2.0000 | ORAL_TABLET | ORAL | Status: DC | PRN

## 2014-03-04 NOTE — ED Provider Notes (Signed)
CSN: [id number]     Arrival date & time 03/04/14  2139 History  This chart was scribed for Noah Creasehristopher J. Dandre Sisler, MD by Smiley HousemanFallon Davis, ED Scribe. The patient was seen in room MH03/MH03. Patient's care was started at 10:12 PM.  Chief Complaint  Patient presents with  . Fall   The history is provided by the patient. No language interpreter was used.   HPI Comments: Noah [name] is a 63 y.o. male who presents to the Emergency Department complaining of constant worsening right elbow pain that started after he fell and hit his elbow on the cement about 3 hours ago.  Pt has associated swelling to his right elbow.  Pt states he has a bugling disc in his back, which will cause his back to give out and caused him to fall.  Pt denies any other injuries.  He denies hitting his head and LOC.  Pt states he takes hydrocodone for his back, but is currently out of his medications.    Past Medical History  Diagnosis Date  . [facility]  . History of hepatitis C 04/15/2009    Qualifier: Diagnosis of  By: Alwyn RenHopper MD, Chrissie NoaWilliam  S/P pegylated interferon & ribavirin for 12 months.S/P liver biopsy 2006, Dr Melvia Heapsobert  Kaplan, GI. ? [place] 63; diagnosed age 63 @ [facility]    Past Surgical History  Procedure Laterality Date  . Colonoscopy  2003  . Percutaneous liver biopsy  2006    Dr Arlyce DiceKaplan  . Tonsillectomy     Family History  Problem Relation Age of Onset  . Peripheral vascular disease Mother   . Alcohol abuse Father 1860   History  Substance Use Topics  . Smoking status: Never Smoker   . Smokeless tobacco: Never Used  . Alcohol Use: Yes    Review of Systems  Constitutional: Negative for fever and chills.  Gastrointestinal: Negative for nausea, vomiting, abdominal pain and diarrhea.  Musculoskeletal: Positive for arthralgias (Right elbow) and joint swelling.  Skin: Negative for color change and rash.  Psychiatric/Behavioral: Negative for behavioral problems and  confusion.  All other systems reviewed and are negative.   Allergies  Review of patient's allergies indicates no known allergies.  Home Medications   Prior to Admission medications   Medication Sig Start Date End Date Taking? Authorizing Provider  HYDROcodone-acetaminophen (NORCO/VICODIN) 5-325 MG per tablet Take 0.5-1 tablets by mouth 2 (two) times daily as needed. 02/20/14   Wanda PlumpJose E Paz, MD  predniSONE (DELTASONE) 10 MG tablet 4 tablets x 2 days, 3 tabs x 2 days, 2 tabs x 2 days, 1 tab x 2 days 02/20/14   Wanda PlumpJose E Paz, MD   Triage Vitals: BP 144/83  Pulse 82  Temp(Src) 98.5 F (36.9 C) (Oral)  Resp 18  Ht 5' 9.5" (1.765 m)  Wt 180 lb (81.647 kg)  BMI 26.21 kg/m2  SpO2 99%  Physical Exam  Constitutional: He is oriented to person, place, and time. He appears well-developed and well-nourished. No distress.  HENT:  Head: Normocephalic and atraumatic.  Right Ear: Hearing normal.  Left Ear: Hearing normal.  Mouth/Throat: Mucous membranes are normal.  Eyes: Conjunctivae and EOM are normal.  Neck: Neck supple.  Cardiovascular: Normal rate, S1 normal and S2 normal.   Pulmonary/Chest: Effort normal. No respiratory distress.  Abdominal: Soft. Normal appearance. There is no hepatosplenomegaly. There is tenderness. There is guarding. There is no tenderness at McBurney's point and negative Murphy's sign.  No hernia.  Musculoskeletal: Normal range of motion.       Right elbow: He exhibits swelling. He exhibits normal range of motion and no deformity.  No bony tenderness.  Swelling to the olecranon bursa.    Neurological: He is alert and oriented to person, place, and time. He has normal strength. No cranial nerve deficit or sensory deficit. Coordination normal. GCS eye subscore is 4. GCS verbal subscore is 5. GCS motor subscore is 6.  Skin: Skin is warm, dry and intact. No rash noted. No cyanosis.  Psychiatric: He has a normal mood and affect. His speech is normal and behavior is normal.  Thought content normal.    ED Course  Procedures (including critical care time) DIAGNOSTIC STUDIES: Oxygen Saturation is 99% on RA, normal by my interpretation.    COORDINATION OF CARE: 10:14 PM-Informed pt his x-ray showed normal results.  Patient informed of current plan of treatment and evaluation and agrees with plan.    Imaging Review Dg Elbow Complete Right  03/04/2014   CLINICAL DATA:  Status post fall; hit posterior right elbow on concrete, with pain and swelling.  EXAM: RIGHT ELBOW - COMPLETE 3+ VIEW  COMPARISON:  None.  FINDINGS: There is no evidence of fracture or dislocation. The visualized joint spaces are preserved. No significant joint effusion is identified. Significant soft tissue swelling is noted normal dorsal and ulnar to the distal humerus. A tiny osseous fragment adjacent to the medial epicondyle likely reflects degenerative change.  IMPRESSION: No evidence of acute fracture or dislocation.   Electronically Signed   By: Roanna RaiderJeffery  Chang M.D.   On: 03/04/2014 22:26   MDM   Final diagnoses:  Elbow contusion  Olecranon bursitis of right elbow    Patient presents to the ER for evaluation after a fall. Patient had a mechanical fall caused by his chronic back issues. There was no injury to the back. No head injury. Patient has normal neurologic exam. Musculoskeletal exam reveals no evidence of injury to the elbow. Examination of the elbow did show evidence of traumatic olecranon bursitis without any concern for infection. Patient was counseled to watch for redness, increasing swelling from the area. He understands that this could get infected, but currently would be managed conservatively as it is likely inflammation secondary to trauma. X-ray was negative.     Noah Creasehristopher J. Rudine Rieger, MD 03/07/14 [phone]

## 2014-03-04 NOTE — Discharge Instructions (Signed)
Bursitis Bursitis is a swelling and soreness (inflammation) of a fluid-filled sac (bursa) that overlies and protects a joint. It can be caused by injury, overuse of the joint, arthritis or infection. The joints most likely to be affected are the elbows, shoulders, hips and knees. HOME CARE INSTRUCTIONS   Apply ice to the affected area for 15-20 minutes each hour while awake for 2 days. Put the ice in a plastic bag and place a towel between the bag of ice and your skin.  Rest the injured joint as much as possible, but continue to put the joint through a full range of motion, 4 times per day. (The shoulder joint especially becomes rapidly "frozen" if not used.) When the pain lessens, begin normal slow movements and usual activities.  Only take over-the-counter or prescription medicines for pain, discomfort or fever as directed by your caregiver.  Your caregiver may recommend draining the bursa and injecting medicine into the bursa. This may help the healing process.  Follow all instructions for follow-up with your caregiver. This includes any orthopedic referrals, physical therapy and rehabilitation. Any delay in obtaining necessary care could result in a delay or failure of the bursitis to heal and chronic pain. SEEK IMMEDIATE MEDICAL CARE IF:   Your pain increases even during treatment.  You develop an oral temperature above 102 F (38.9 C) and have heat and inflammation over the involved bursa. MAKE SURE YOU:   Understand these instructions.  Will watch your condition.  Will get help right away if you are not doing well or get worse. Document Released: 10/22/2000 Document Revised: 01/17/2012 Document Reviewed: 09/26/2009 [facility] Patient Information 2014 [place], [place].  Contusion A contusion is a deep bruise. Contusions are the result of an injury that caused bleeding under the skin. The contusion may turn blue, purple, or yellow. Minor injuries will give you a painless contusion,  but more severe contusions may stay painful and swollen for a few weeks.  CAUSES  A contusion is usually caused by a blow, trauma, or direct force to an area of the body. SYMPTOMS   Swelling and redness of the injured area.  Bruising of the injured area.  Tenderness and soreness of the injured area.  Pain. DIAGNOSIS  The diagnosis can be made by taking a history and physical exam. An X-ray, CT scan, or MRI may be needed to determine if there were any associated injuries, such as fractures. TREATMENT  Specific treatment will depend on what area of the body was injured. In general, the best treatment for a contusion is resting, icing, elevating, and applying cold compresses to the injured area. Over-the-counter medicines may also be recommended for pain control. Ask your caregiver what the best treatment is for your contusion. HOME CARE INSTRUCTIONS   Put ice on the injured area.  Put ice in a plastic bag.  Place a towel between your skin and the bag.  Leave the ice on for 15-20 minutes, 03-04 times a day.  Only take over-the-counter or prescription medicines for pain, discomfort, or fever as directed by your caregiver. Your caregiver may recommend avoiding anti-inflammatory medicines (aspirin, ibuprofen, and naproxen) for 48 hours because these medicines may increase bruising.  Rest the injured area.  If possible, elevate the injured area to reduce swelling. SEEK IMMEDIATE MEDICAL CARE IF:   You have increased bruising or swelling.  You have pain that is getting worse.  Your swelling or pain is not relieved with medicines. MAKE SURE YOU:   Understand these  instructions.  Will watch your condition.  Will get help right away if you are not doing well or get worse. Document Released: 08/04/2005 Document Revised: 01/17/2012 Document Reviewed: 08/30/2011 [facility] Patient Information 2014 [place], [place].

## 2014-03-04 NOTE — ED Notes (Signed)
Back gave out this evening and he fell. MRI showed a bad disc per pt. Hit his right elbow on cement. Swelling noted.

## 2014-03-19 ENCOUNTER — Ambulatory Visit (INDEPENDENT_AMBULATORY_CARE_PROVIDER_SITE_OTHER): Payer: No Typology Code available for payment source | Admitting: Internal Medicine

## 2014-03-19 ENCOUNTER — Encounter: Payer: Self-pay | Admitting: Internal Medicine

## 2014-03-19 VITALS — BP 98/62 | HR 71 | Temp 98.3°F | Wt 182.8 lb

## 2014-03-19 DIAGNOSIS — M7030 Other bursitis of elbow, unspecified elbow: Secondary | ICD-10-CM

## 2014-03-19 DIAGNOSIS — M7020 Olecranon bursitis, unspecified elbow: Secondary | ICD-10-CM

## 2014-03-19 DIAGNOSIS — M47817 Spondylosis without myelopathy or radiculopathy, lumbosacral region: Secondary | ICD-10-CM

## 2014-03-19 DIAGNOSIS — M47816 Spondylosis without myelopathy or radiculopathy, lumbar region: Secondary | ICD-10-CM

## 2014-03-19 NOTE — Patient Instructions (Signed)
Please go to the labs for a urine test

## 2014-03-19 NOTE — Progress Notes (Signed)
   Subjective:    Patient ID: Noah [name], male    DOB: 09-Sep-1951, 63 y.o.   MRN: [id number]  DOS:  03/19/2014 Type of  visit: Acute visit Status post a fall , landed on his right elbow, went to the ER 03-04-14, x-rays negative. Here because the pain has decreased but he still has swelling.    ROS Continue with back pain, reports good compliance with hydrocodone half tablet twice a day.   Past Medical History  Diagnosis Date  . [facility]  . History of hepatitis C 04/15/2009    Qualifier: Diagnosis of  By: Alwyn RenHopper MD, Chrissie NoaWilliam  S/P pegylated interferon & ribavirin for 12 months.S/P liver biopsy 2006, Dr Melvia Heapsobert  Kaplan, GI. ? [place] 63; diagnosed age 63 @ [facility]     Past Surgical History  Procedure Laterality Date  . Colonoscopy  2003  . Percutaneous liver biopsy  2006    Dr Arlyce DiceKaplan  . Tonsillectomy      History   Social History  . Marital Status: Divorced    Spouse Name: N/A    Number of Children: N/A  . Years of Education: N/A   Occupational History  . retired    Social History Main Topics  . Smoking status: Never Smoker   . Smokeless tobacco: Never Used  . Alcohol Use: Yes  . Drug Use: No  . Sexual Activity: Not on file   Other Topics Concern  . Not on file   Social History Narrative  . No narrative on file        Medication List       This list is accurate as of: 03/19/14  2:11 PM.  Always use your most recent med list.               HYDROcodone-acetaminophen 5-325 MG per tablet  Commonly known as:  NORCO/VICODIN  Take 0.5-1 tablets by mouth 2 (two) times daily as needed.     predniSONE 10 MG tablet  Commonly known as:  DELTASONE  4 tablets x 2 days, 3 tabs x 2 days, 2 tabs x 2 days, 1 tab x 2 days           Objective:   Physical Exam BP 98/62  Pulse 71  Temp(Src) 98.3 F (36.8 C) (Oral)  Wt 182 lb 12.8 oz (82.918 kg)  SpO2 96% General -- alert, well-developed, NAD.   Extremities--  Left  elbow normal Right elbow without deformities, palpation showed a quite large bursa, fluctuant, not warm, no red, and not tender. Range of motion is normal. Neurologic--  alert & oriented X3. Speech normal, gait normal, strength normal in all extremities.  Psych-- Cognition and judgment appear intact. Cooperative with normal attention span and concentration. No anxious or depressed appearing.      Assessment & Plan:   Bursitis, Posttraumatic bursitis, recommend a sports   medicine referral for consideration of drainage, he likes so to see Dr. Riley KillSwartz. Referral will be arranged

## 2014-03-19 NOTE — Progress Notes (Signed)
Pre visit review using our clinic review tool, if applicable. No additional management support is needed unless otherwise documented below in the visit note. 

## 2014-03-19 NOTE — Assessment & Plan Note (Addendum)
Ongoing pain, reports good compliance with hydrocodone 1/2 tab  twice a day. Has not run out of meds. UDS 10-2013  showed no medications in his system Plan: Repeat UDS today Addendum, states he can't urinate----> no further prescriptions of controlled substances without UDS

## 2014-03-22 ENCOUNTER — Telehealth: Payer: Self-pay | Admitting: Internal Medicine

## 2014-03-22 ENCOUNTER — Telehealth: Payer: Self-pay

## 2014-03-22 NOTE — Telephone Encounter (Signed)
Patient is requesting to see Dr. Riley KillSwartz. He said that Dr. Renae FicklePaul recommends he comes back here to have fluid drew off his elbow and a 2nd opinion regarding his MRI.

## 2014-03-22 NOTE — Telephone Encounter (Signed)
Advise patient UDS   + cocaine.  1. no further controlled substances from this office  2. recommend To contact [place] to help manage use of illicit  drugs..Marland Kitchen

## 2014-03-25 NOTE — Telephone Encounter (Signed)
Left message for call back Non identifiable  

## 2014-03-25 NOTE — Telephone Encounter (Signed)
i will see him

## 2014-03-25 NOTE — Telephone Encounter (Signed)
Patient has a appt scheduled for 6/5 with Dr. Riley KillSwartz to discuss different pain management options.

## 2014-03-27 NOTE — Telephone Encounter (Signed)
Left message for call back Non identifiable  

## 2014-03-28 NOTE — Telephone Encounter (Signed)
Unable to reach letter sent

## 2014-04-05 ENCOUNTER — Telehealth: Payer: Self-pay | Admitting: *Deleted

## 2014-04-05 NOTE — Telephone Encounter (Signed)
Spoke with pt after he presented to the office requesting a refill on pain med. I advised the pt of his recent UDS results and that Dr. Drue Novel will not longer prescribe controlled meds. He verbalized understanding.

## 2014-04-12 ENCOUNTER — Encounter: Payer: Medicaid Other | Attending: Physical Medicine & Rehabilitation | Admitting: Physical Medicine & Rehabilitation

## 2014-04-12 ENCOUNTER — Encounter: Payer: Self-pay | Admitting: Physical Medicine & Rehabilitation

## 2014-04-12 VITALS — BP 158/102 | HR 65 | Resp 14 | Ht 69.0 in | Wt 191.0 lb

## 2014-04-12 DIAGNOSIS — M51379 Other intervertebral disc degeneration, lumbosacral region without mention of lumbar back pain or lower extremity pain: Secondary | ICD-10-CM | POA: Insufficient documentation

## 2014-04-12 DIAGNOSIS — M545 Low back pain, unspecified: Secondary | ICD-10-CM | POA: Insufficient documentation

## 2014-04-12 DIAGNOSIS — M5137 Other intervertebral disc degeneration, lumbosacral region: Secondary | ICD-10-CM | POA: Insufficient documentation

## 2014-04-12 DIAGNOSIS — G8929 Other chronic pain: Secondary | ICD-10-CM | POA: Diagnosis present

## 2014-04-12 DIAGNOSIS — M5416 Radiculopathy, lumbar region: Secondary | ICD-10-CM | POA: Insufficient documentation

## 2014-04-12 DIAGNOSIS — M47817 Spondylosis without myelopathy or radiculopathy, lumbosacral region: Secondary | ICD-10-CM | POA: Insufficient documentation

## 2014-04-12 DIAGNOSIS — M79609 Pain in unspecified limb: Secondary | ICD-10-CM | POA: Diagnosis not present

## 2014-04-12 DIAGNOSIS — M6688 Spontaneous rupture of other tendons, other: Secondary | ICD-10-CM | POA: Insufficient documentation

## 2014-04-12 DIAGNOSIS — M1710 Unilateral primary osteoarthritis, unspecified knee: Secondary | ICD-10-CM | POA: Insufficient documentation

## 2014-04-12 DIAGNOSIS — M1711 Unilateral primary osteoarthritis, right knee: Secondary | ICD-10-CM

## 2014-04-12 DIAGNOSIS — IMO0002 Reserved for concepts with insufficient information to code with codable children: Secondary | ICD-10-CM

## 2014-04-12 MED ORDER — HYDROCODONE-ACETAMINOPHEN 5-325 MG PO TABS: 0.5000 | ORAL_TABLET | Freq: Two times a day (BID) | ORAL | Status: DC | PRN

## 2014-04-12 MED ORDER — GABAPENTIN 100 MG PO CAPS: 100.0000 mg | ORAL_CAPSULE | Freq: Three times a day (TID) | ORAL | Status: DC

## 2014-04-12 MED ORDER — PREDNISONE 20 MG PO TABS: 20.0000 mg | ORAL_TABLET | ORAL | Status: DC

## 2014-04-12 NOTE — Progress Notes (Signed)
Subjective:    Patient ID: Noah [name], male    DOB: 02-28-1951, 63 y.o.   MRN: [id number]  HPI  Mr. Noah FeeRender is back regarding his chronic pain. He had an MRI of his lumbar spine to assess low back and left leg pain.   The MRI revealed: L3-4: Degenerative disc disease with a broad-based bulging  degenerated annulus and osteophytic ridging. This in combination  with hypertrophic facet disease and ligamentum flavum thickening  creates mild to moderate spinal stenosis and moderate bilateral  lateral recess stenosis. There is also mild left greater than right  foraminal stenosis.  L4-5: Degenerative disc disease with a bulging annulus and  osteophytic ridging. There is flattening of the ventral thecal sac  but no significant spinal or lateral recess stenosis. Mild right  foraminal stenosis due to a shallow broad-based disc protrusion and  spurring. Moderate facet disease.  L5-S1: Moderate to large sized focal central disc protrusion with  mass effect on the thecal sac and S1 nerve roots. Advanced  hypertrophic facet disease. There is mild foraminal stenosis on the  Right.  His pain is typically from the left buttock down the back of the left leg to the sole of the foot. It tends to be worst in the morning when he wakes up and improves during the day. He sometimes has some tingling in the morning but the tingling goes away as well. He denies any weakness. He tries to stay somewhat active as a whole.   Pain Inventory Average Pain 7 Pain Right Now 8 My pain is intermittent and sharp  In the last 24 hours, has pain interfered with the following? General activity 8 Relation with others 3 Enjoyment of life 8 What TIME of day is your pain at its worst? morning, night Sleep (in general) Poor  Pain is worse with: resting Pain improves with: medication Relief from Meds: 8  Mobility walk without assistance ability to climb steps?  yes do you drive?  yes transfers  alone  Function retired I need assistance with the following:  household duties and shopping  Neuro/Psych numbness trouble walking spasms depression anxiety  Prior Studies Any changes since last visit?  yes x-rays CT/MRI  Physicians involved in your care Any changes since last visit?  no   Family History  Problem Relation Age of Onset  . Peripheral vascular disease Mother   . Alcohol abuse Father [id number]   History   Social History  . Marital Status: Divorced    Spouse Name: N/A    Number of Children: N/A  . Years of Education: N/A   Occupational History  . retired    Social History Main Topics  . Smoking status: Never Smoker   . Smokeless tobacco: Never Used  . Alcohol Use: Yes  . Drug Use: No  . Sexual Activity: None   Other Topics Concern  . None   Social History Narrative  . None   Past Surgical History  Procedure Laterality Date  . Colonoscopy  2003  . Percutaneous liver biopsy  2006    Dr Arlyce DiceKaplan  . Tonsillectomy     Past Medical History  Diagnosis Date  . [facility]  . History of hepatitis C 04/15/2009    Qualifier: Diagnosis of  By: Noah [name], Noah NoaWilliam  S/P pegylated interferon & ribavirin for 12 months.S/P liver biopsy 2006, Dr Melvia Heapsobert  [facility], GI. ? [place] 63; diagnosed age 89 @ [facility]  BP 158/102  Pulse 65  Resp 14  Ht 5\' 9"  (1.753 m)  Wt 191 lb (86.637 kg)  BMI 28.19 kg/m2  SpO2 99%  Opioid Risk Score:   Fall Risk Score: High Fall Risk (>13 points) (pt educated on fall risk, brochure given to pt)    Review of Systems  Constitutional: Positive for unexpected weight change.  Musculoskeletal: Positive for back pain and gait problem.  Neurological: Positive for numbness.       Spasms  Psychiatric/Behavioral: The patient is nervous/anxious.        Depression  All other systems reviewed and are negative.      Objective:   Physical Exam  Constitutional: He appears well-developed and  well-nourished.  HENT:  Head: Normocephalic and atraumatic.  Right Ear: External ear normal.  Left Ear: External ear normal.  Eyes: Conjunctivae and EOM are normal. Pupils are equal, round, and reactive to light.  Neck: Normal range of motion.  Cardiovascular: Normal rate and regular rhythm.  Pulmonary/Chest: Effort normal. No respiratory distress. He has no wheezes.  Abdominal: He exhibits no distension. There is no tenderness.  Musculoskeletal:  Right knee: He exhibits minimal tenderness at right knee. Meniscal maneuvers are negative. He exhibits no swelling, no effusion, normal alignment and no LCL laxity.  Thoracic back: He exhibits decreased range of motion and tenderness.  Left low back tenderness with palpation, especially with palpation of his left PSIS and lower lumbar spine. SLR/seated slump test is positive. All muscles 5/5. No sensory changes. No antalgia on the left. Posture fair to good.   . Gait pattern normal. Psychiatric: He has a normal mood and affect. His behavior is normal. Judgment and thought content normal.    Assessment & Plan:   1. 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 This has remained stable and in fact has improved  -continue current management   2. 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 Low back pain. 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 -recommend oral prednisone taper -gabapentin trial 100mg  tid -good posture and stretching were reviewed also -consider L5-S1 translaminar ESI  4. F/u with me in 6 weeks  -hydrocodone was refilled---further rx will require a CSA here

## 2014-04-12 NOTE — Patient Instructions (Signed)
WORK ON REGULAR STRETCHING 

## 2014-05-31 ENCOUNTER — Other Ambulatory Visit: Payer: Self-pay | Admitting: Physical Medicine & Rehabilitation

## 2014-06-03 ENCOUNTER — Encounter: Payer: Medicaid Other | Admitting: Physical Medicine & Rehabilitation

## 2014-06-03 ENCOUNTER — Telehealth: Payer: Self-pay

## 2014-06-03 DIAGNOSIS — IMO0002 Reserved for concepts with insufficient information to code with codable children: Secondary | ICD-10-CM

## 2014-06-03 DIAGNOSIS — M47817 Spondylosis without myelopathy or radiculopathy, lumbosacral region: Secondary | ICD-10-CM

## 2014-06-03 DIAGNOSIS — M1731 Unilateral post-traumatic osteoarthritis, right knee: Secondary | ICD-10-CM

## 2014-06-03 MED ORDER — PREDNISONE 20 MG PO TABS: 20.0000 mg | ORAL_TABLET | ORAL | Status: DC

## 2014-06-03 MED ORDER — HYDROCODONE-ACETAMINOPHEN 5-325 MG PO TABS
0.5000 | ORAL_TABLET | Freq: Two times a day (BID) | ORAL | Status: DC | PRN
Start: 2014-06-03 — End: 2015-10-26

## 2014-06-03 NOTE — Telephone Encounter (Signed)
Attempted to contact patient to inform him that his Hydrocodone RX is ready for pickup. Also that he will not receive any further refills without a appt. RX and CSA is in the book. Left a voicemail to return call to clinic.

## 2014-06-03 NOTE — Telephone Encounter (Signed)
Contact patient to inform him that the Hydrocodone and Prednisone RX is ready for pickup. Informed patient that this will be the last refill without appt per Dr. Riley KillSwartz. Patient verbalized understanding.

## 2014-06-03 NOTE — Telephone Encounter (Signed)
Patient is requesting a refill on Prednisone and Hydrocodone. Please advise.

## 2014-06-03 NOTE — Telephone Encounter (Signed)
Written. No further meds without visit and CSA

## 2014-06-24 ENCOUNTER — Ambulatory Visit (INDEPENDENT_AMBULATORY_CARE_PROVIDER_SITE_OTHER): Payer: No Typology Code available for payment source | Admitting: Internal Medicine

## 2014-06-24 ENCOUNTER — Encounter: Payer: Self-pay | Admitting: Internal Medicine

## 2014-06-24 VITALS — BP 124/78 | HR 56 | Temp 98.0°F | Wt 192.4 lb

## 2014-06-24 DIAGNOSIS — Z8619 Personal history of other infectious and parasitic diseases: Secondary | ICD-10-CM

## 2014-06-24 DIAGNOSIS — M47816 Spondylosis without myelopathy or radiculopathy, lumbar region: Secondary | ICD-10-CM

## 2014-06-24 DIAGNOSIS — M47817 Spondylosis without myelopathy or radiculopathy, lumbosacral region: Secondary | ICD-10-CM

## 2014-06-24 LAB — COMPREHENSIVE METABOLIC PANEL
ALBUMIN: 3.4 g/dL — AB (ref 3.5–5.2)
ALK PHOS: 37 U/L — AB (ref 39–117)
ALT: 12 U/L (ref 0–53)
AST: 15 U/L (ref 0–37)
BILIRUBIN TOTAL: 1.1 mg/dL (ref 0.2–1.2)
BUN: 15 mg/dL (ref 6–23)
CHLORIDE: 104 meq/L (ref 96–112)
CO2: 27 mEq/L (ref 19–32)
Calcium: 8.8 mg/dL (ref 8.4–10.5)
Creatinine, Ser: 0.9 mg/dL (ref 0.4–1.5)
GFR: 105.35 mL/min (ref >60.00)
GLUCOSE: 84 mg/dL (ref 70–99)
Potassium: 3.8 mEq/L (ref 3.5–5.1)
Sodium: 139 mEq/L (ref 135–145)
TOTAL PROTEIN: 6.4 g/dL (ref 6.0–8.3)

## 2014-06-24 LAB — CBC WITH DIFFERENTIAL/PLATELET
Basophils Absolute: 0 10*3/uL (ref 0.0–0.1)
Basophils Relative: 0.3 % (ref 0.0–3.0)
EOS PCT: 1.1 % (ref 0.0–5.0)
Eosinophils Absolute: 0.1 10*3/uL (ref 0.0–0.7)
HEMATOCRIT: 40.4 % (ref 39.0–52.0)
Hemoglobin: 13.4 g/dL (ref 13.0–17.0)
Lymphocytes Relative: 34.6 % (ref 12.0–46.0)
Lymphs Abs: 2.2 10*3/uL (ref 0.7–4.0)
MCHC: 33.2 g/dL (ref 30.0–36.0)
MCV: 88.6 fl (ref 78.0–100.0)
MONO ABS: 0.7 10*3/uL (ref 0.1–1.0)
Monocytes Relative: 11 % (ref 3.0–12.0)
NEUTROS PCT: 53 % (ref 43.0–77.0)
Neutro Abs: 3.4 10*3/uL (ref 1.4–7.7)
Platelets: 259 10*3/uL (ref 150.0–400.0)
RBC: 4.56 Mil/uL (ref 4.22–5.81)
RDW: 14 % (ref 11.5–15.5)
WBC: 6.5 10*3/uL (ref 4.0–10.5)

## 2014-06-24 NOTE — Patient Instructions (Signed)
Get your blood work before you leave   Next visit is for a physical exam in 3-4 months , fasting Please make an appointment    

## 2014-06-24 NOTE — Progress Notes (Signed)
Pre-visit discussion using our clinic review tool. No additional management support is needed unless otherwise documented below in the visit note.  

## 2014-06-24 NOTE — Progress Notes (Signed)
   Subjective:    Patient ID: Noah RastDelbert S Saephanh, male    DOB: [birth year], 63 y.o.   MRN: [id number]  DOS:  06/24/2014 Type of visit - description: request a check regards hepatitis History: In general feels well, reports he drinks a beer sometimes, socially    ROS Denies any nausea, vomiting, diarrhea. No rash  Past Medical History  Diagnosis Date  . [facility] C [facility]  . History of hepatitis C 04/15/2009      S/P pegylated interferon & ribavirin for 12 months.S/P liver biopsy 2006, Dr Melvia Heapsobert  [facility], GI. ? [place] 63; diagnosed age 63 @ [facility]     Past Surgical History  Procedure Laterality Date  . Colonoscopy  2003  . Percutaneous liver biopsy  2006    Dr Arlyce DiceKaplan  . Tonsillectomy      History   Social History  . Marital Status: Divorced    Spouse Name: N/A    Number of Children: 2  . Years of Education: N/A   Occupational History  . retired    Social History Main Topics  . Smoking status: Never Smoker   . Smokeless tobacco: Never Used  . Alcohol Use: Yes     Comment: beer  . Drug Use: Yes     Comment: see UDS   . Sexual Activity: Not on file   Other Topics Concern  . Not on file   Social History Narrative   Lives by himself        Medication List       This list is accurate as of: 06/24/14  8:49 AM.  Always use your most recent med list.               gabapentin 100 MG capsule  Commonly known as:  NEURONTIN  Take 1 capsule (100 mg total) by mouth 3 (three) times daily.     HYDROcodone-acetaminophen 5-325 MG per tablet  Commonly known as:  NORCO/VICODIN  Take 0.5-1 tablets by mouth 2 (two) times daily as needed.     predniSONE 20 MG tablet  Commonly known as:  DELTASONE  Take 1 tablet (20 mg total) by mouth as directed. Take 1 tab 3x daily for 4 days then 1 tab 2x daily for 4 days then 1 tab once daily for 4 days, then 1/2 tab daily for 4 days and off.           Objective:   Physical Exam BP 124/78  Pulse 56   Temp(Src) 98 F (36.7 C) (Oral)  Wt 192 lb 6 oz (87.261 kg)  SpO2 99% General -- alert, well-developed, NAD.   Lungs -- normal respiratory effort, no intercostal retractions, no accessory muscle use, and normal breath sounds.  Heart-- normal rate, regular rhythm, no murmur.  Psych-- Cognition and judgment appear intact. Cooperative with normal attention span and concentration. No anxious or depressed appearing.      Assessment & Plan:

## 2014-06-24 NOTE — Assessment & Plan Note (Signed)
Last UDS + cocaine, pain managed per Dr Riley KillSwartz, no further controlled substances from this office

## 2014-06-24 NOTE — Assessment & Plan Note (Addendum)
History of hepatitis C, biopsy in 2006, treated w/  pegylated interferon & ribavirin for 12 months Will check LFTs; chart was reviewed (Epic and centricity EMRs) , not much information or details regards hepatitis C.

## 2014-06-26 ENCOUNTER — Other Ambulatory Visit: Payer: Self-pay | Admitting: Internal Medicine

## 2014-06-26 DIAGNOSIS — Z8619 Personal history of other infectious and parasitic diseases: Secondary | ICD-10-CM

## 2014-07-01 ENCOUNTER — Telehealth: Payer: Self-pay | Admitting: Orthopedic Surgery

## 2014-07-01 NOTE — Telephone Encounter (Signed)
Okay to refer?  That was your recommendation back in March but client was uninsured then.

## 2014-07-01 NOTE — Telephone Encounter (Signed)
Patient now has Medicaid and wants to discuss a referral to a neurosurgeon.

## 2014-07-02 ENCOUNTER — Telehealth: Payer: Self-pay | Admitting: *Deleted

## 2014-07-02 NOTE — Telephone Encounter (Signed)
Absolutely not

## 2014-07-02 NOTE — Telephone Encounter (Signed)
Contacted patient to inform him that he could not receive a refill on Hydrocodone due to a +UDS with Dr. Drue Novel. Patient seemed upset and asked "why I was calling him then" Tried explaining to the patient that I was returning his call.

## 2014-07-02 NOTE — Telephone Encounter (Signed)
Called for a refill on his medication. ?hydrocodone last rx 06/03/14 by you.  Was previously non narcotic 05/01/13 for no med in his urine.  2 documented UDS noting that (-) for meds from our office and another.  Dr Drue Novel noted 06/24/14 that their last UDS +cocaine. He has never signed the CSA that was put up front with rx 06/03/14.  Are you going to continue to prescribe?

## 2014-07-03 NOTE — Telephone Encounter (Signed)
refer

## 2014-07-18 NOTE — Telephone Encounter (Signed)
Awaiting return call from patient, patient needs to advise what type of Medicaid he has, may require referral from PCP

## 2014-07-19 ENCOUNTER — Encounter: Payer: Medicaid Other | Attending: Physical Medicine & Rehabilitation | Admitting: Physical Medicine & Rehabilitation

## 2014-07-19 ENCOUNTER — Encounter: Payer: Self-pay | Admitting: Physical Medicine & Rehabilitation

## 2014-07-19 VITALS — BP 129/73 | HR 81 | Resp 16 | Ht 69.0 in | Wt 180.0 lb

## 2014-07-19 DIAGNOSIS — G8929 Other chronic pain: Secondary | ICD-10-CM | POA: Insufficient documentation

## 2014-07-19 DIAGNOSIS — M79609 Pain in unspecified limb: Secondary | ICD-10-CM | POA: Diagnosis not present

## 2014-07-19 DIAGNOSIS — M47817 Spondylosis without myelopathy or radiculopathy, lumbosacral region: Secondary | ICD-10-CM

## 2014-07-19 DIAGNOSIS — M5137 Other intervertebral disc degeneration, lumbosacral region: Secondary | ICD-10-CM | POA: Diagnosis not present

## 2014-07-19 DIAGNOSIS — M545 Low back pain, unspecified: Secondary | ICD-10-CM | POA: Diagnosis not present

## 2014-07-19 DIAGNOSIS — M1710 Unilateral primary osteoarthritis, unspecified knee: Secondary | ICD-10-CM | POA: Diagnosis not present

## 2014-07-19 DIAGNOSIS — IMO0002 Reserved for concepts with insufficient information to code with codable children: Secondary | ICD-10-CM

## 2014-07-19 DIAGNOSIS — M5416 Radiculopathy, lumbar region: Secondary | ICD-10-CM

## 2014-07-19 DIAGNOSIS — M6688 Spontaneous rupture of other tendons, other: Secondary | ICD-10-CM | POA: Insufficient documentation

## 2014-07-19 DIAGNOSIS — M51379 Other intervertebral disc degeneration, lumbosacral region without mention of lumbar back pain or lower extremity pain: Secondary | ICD-10-CM | POA: Insufficient documentation

## 2014-07-19 MED ORDER — GABAPENTIN 300 MG PO CAPS: 300.0000 mg | ORAL_CAPSULE | Freq: Three times a day (TID) | ORAL | Status: DC

## 2014-07-19 NOTE — Patient Instructions (Addendum)
PLEASE CALL ME WITH ANY PROBLEMS OR QUESTIONS (#[phone]).     GABAPENTIN:  AT NIGHT FOR 4 DAYS THEN---  TWICE DAILY FOR 4 DAYS, THEN---  THREE X DAILY

## 2014-07-19 NOTE — Progress Notes (Signed)
Subjective:    Patient ID: Noah [name], male    DOB: Nov 09, 1950, 63 y.o.   MRN: [id number]  HPI  Mr. Fleener is back regarding his chronic pain issues. He has continued to have low back pain with radiation down the side/back of his left leg to his foot. He is really not taking anything presently for pain.  We reviewed his MRI again today from March which reveals multi-level disk and facet disease. His most severe level is the L5-S1 with a large central disc protrusion and severe facet arthropathy.  He has been out of his gabapentin for some time. He is no longer receiving hydrocodone due to inconsistent/failed drug tests.   Pain Inventory Average Pain 8 Pain Right Now 8 My pain is sharp, burning, dull and aching  In the last 24 hours, has pain interfered with the following? General activity 8 Relation with others 8 Enjoyment of life 8 What TIME of day is your pain at its worst? morning Sleep (in general) Poor  Pain is worse with: walking, bending, sitting, inactivity, standing, unsure and some activites Pain improves with: rest and medication Relief from Meds: 4  Mobility walk without assistance ability to climb steps?  yes do you drive?  yes transfers alone  Function disabled: date disabled na  Neuro/Psych No problems in this area  Prior Studies Any changes since last visit?  no  Physicians involved in your care Any changes since last visit?  no   Family History  Problem Relation Age of Onset  . Peripheral vascular disease Mother   . Alcohol abuse Father 48   History   Social History  . Marital Status: Divorced    Spouse Name: N/A    Number of Children: 2  . Years of Education: N/A   Occupational History  . retired    Social History Main Topics  . Smoking status: Never Smoker   . Smokeless tobacco: Never Used  . Alcohol Use: Yes     Comment: beer  . Drug Use: Yes     Comment: see UDS   . Sexual Activity: None   Other Topics Concern  .  None   Social History Narrative   Lives by himself   Past Surgical History  Procedure Laterality Date  . Colonoscopy  2003  . Percutaneous liver biopsy  2006    Dr Arlyce Dice  . Tonsillectomy     Past Medical History  Diagnosis Date  . Hepatitis C 1990    [facility]  . History of hepatitis C 04/15/2009      S/P pegylated interferon & ribavirin for 12 months.S/P liver biopsy 2006, Dr Melvia Heaps, GI. ? [place] 25; diagnosed age 13 @ [facility]    BP 129/73  Pulse 81  Resp 16  Ht  (1.753 m)  Wt 180 lb (81.647 kg)  BMI 26.57 kg/m2  SpO2 97%  Opioid Risk Score:   Fall Risk Score:      Review of Systems  Musculoskeletal: Positive for back pain.  All other systems reviewed and are negative.      Objective:   Physical Exam  Constitutional: He appears well-developed and well-nourished.  HENT:  Head: Normocephalic and atraumatic.  Right Ear: External ear normal.  Left Ear: External ear normal.  Eyes: Conjunctivae and EOM are normal. Pupils are equal, round, and reactive to light.  Neck: Normal range of motion.  Cardiovascular: Normal rate and regular rhythm.  Pulmonary/Chest: Effort normal.  No respiratory distress. He has no wheezes.  Abdominal: He exhibits no distension. There is no tenderness.  Musculoskeletal:  Right knee: He exhibits minimal tenderness at right knee. Meniscal maneuvers are negative. He exhibits no swelling, no effusion, normal alignment and no LCL laxity.  Thoracic back: He exhibits decreased range of motion and tenderness.  Left low back tenderness with palpation. He is able to bend and touch toes. . SLR/seated slump test is positive on the left. All muscles 5/5. No sensory changes. No antalgia on the left. Posture fair to good. DTRs 2+ at knees, right achilles, absent left achilles  . Gait pattern normal. Psychiatric: He has a normal mood and affect. His behavior is normal. Judgment and thought content normal.   Assessment & Plan:    1. 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 This has remained stable and in fact has improved  -continue current management  2. 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 Lumbar disc disease/spondylosis with radiculopathy. Findings most severe at L5-S1 which is consistent with his exam  -gabapentin --increase to  tid -good posture and stretching were reviewed also  -will request an L5-S1 translaminar ESI  4. F/u with me after ESI -hydrocodone was not filled--he is non-narcotic given history of cocaine and inconsistent UDS's. -consider NS referral depending on course/response to [facility]

## 2014-07-19 NOTE — Telephone Encounter (Signed)
LEFT VM WITH PATIENT ADVISED REFERRAL ON HOLD UNTIL HE PROVIDED INSURANCE INFORMATION, WILL AWAIT RETURN PHONE CALL

## 2014-07-22 ENCOUNTER — Telehealth: Payer: Self-pay | Admitting: *Deleted

## 2014-07-22 NOTE — Telephone Encounter (Signed)
NOTE SENT VIA EPIC TO PATIENTS PCP [facility], DR Romeo Apple RECOMMENDS REFERRAL TO NEUROSURGERY, DUE TO PATIENT HAVING  ACCESS MEDICAID, REFERRAL WILL HAVE TO COME FROM PCP

## 2014-07-28 ENCOUNTER — Encounter (HOSPITAL_BASED_OUTPATIENT_CLINIC_OR_DEPARTMENT_OTHER): Payer: Self-pay | Admitting: Emergency Medicine

## 2014-07-28 ENCOUNTER — Emergency Department (HOSPITAL_BASED_OUTPATIENT_CLINIC_OR_DEPARTMENT_OTHER)
Admission: EM | Admit: 2014-07-28 | Discharge: 2014-07-28 | Disposition: A | Discharge status: Home / Self Care | Payer: Medicaid Other | Attending: Emergency Medicine | Admitting: Emergency Medicine

## 2014-07-28 ENCOUNTER — Emergency Department (HOSPITAL_BASED_OUTPATIENT_CLINIC_OR_DEPARTMENT_OTHER): Payer: Medicaid Other

## 2014-07-28 DIAGNOSIS — M545 Low back pain, unspecified: Secondary | ICD-10-CM

## 2014-07-28 DIAGNOSIS — Z79899 Other long term (current) drug therapy: Secondary | ICD-10-CM | POA: Diagnosis not present

## 2014-07-28 DIAGNOSIS — Y9389 Activity, other specified: Secondary | ICD-10-CM | POA: Insufficient documentation

## 2014-07-28 DIAGNOSIS — W108XXA Fall (on) (from) other stairs and steps, initial encounter: Secondary | ICD-10-CM | POA: Diagnosis not present

## 2014-07-28 DIAGNOSIS — Z8619 Personal history of other infectious and parasitic diseases: Secondary | ICD-10-CM | POA: Diagnosis not present

## 2014-07-28 DIAGNOSIS — Y9289 Other specified places as the place of occurrence of the external cause: Secondary | ICD-10-CM | POA: Diagnosis not present

## 2014-07-28 DIAGNOSIS — IMO0002 Reserved for concepts with insufficient information to code with codable children: Secondary | ICD-10-CM | POA: Diagnosis not present

## 2014-07-28 DIAGNOSIS — W19XXXA Unspecified fall, initial encounter: Secondary | ICD-10-CM

## 2014-07-28 MED ORDER — KETOROLAC TROMETHAMINE 60 MG/2ML IM SOLN
60.0000 mg | Freq: Once | INTRAMUSCULAR | Status: AC
  Administered 2014-07-28: 60 mg via INTRAMUSCULAR
  Filled 2014-07-28: qty 2

## 2014-07-28 MED ORDER — HYDROMORPHONE HCL 1 MG/ML IJ SOLN: 0.5000 mg | Freq: Once | INTRAMUSCULAR | Status: DC

## 2014-07-28 NOTE — Discharge Instructions (Signed)
Back Pain, Adult °Low back pain is very common. About 1 in 5 people have back pain. The cause of low back pain is rarely dangerous. The pain often gets better over time. About half of people with a sudden onset of back pain feel better in just 2 weeks. About 8 in 10 people feel better by 6 weeks.  °CAUSES °Some common causes of back pain include: °· Strain of the muscles or ligaments supporting the spine. °· Wear and tear (degeneration) of the spinal discs. °· Arthritis. °· Direct injury to the back. °DIAGNOSIS °Most of the time, the direct cause of low back pain is not known. However, back pain can be treated effectively even when the exact cause of the pain is unknown. Answering your caregiver's questions about your overall health and symptoms is one of the most accurate ways to make sure the cause of your pain is not dangerous. If your caregiver needs more information, he or she may order lab work or imaging tests (X-rays or MRIs). However, even if imaging tests show changes in your back, this usually does not require surgery. °HOME CARE INSTRUCTIONS °For many people, back pain returns. Since low back pain is rarely dangerous, it is often a condition that people can learn to manage on their own.  °· Remain active. It is stressful on the back to sit or stand in one place. Do not sit, drive, or stand in one place for more than 30 minutes at a time. Take short walks on level surfaces as soon as pain allows. Try to increase the length of time you walk each day. °· Do not stay in bed. Resting more than 1 or 2 days can delay your recovery. °· Do not avoid exercise or work. Your body is made to move. It is not dangerous to be active, even though your back may hurt. Your back will likely heal faster if you return to being active before your pain is gone. °· Pay attention to your body when you  bend and lift. Many people have less discomfort when lifting if they bend their knees, keep the load close to their bodies, and  avoid twisting. Often, the most comfortable positions are those that put less stress on your recovering back. °· Find a comfortable position to sleep. Use a firm mattress and lie on your side with your knees slightly bent. If you lie on your back, put a pillow under your knees. °· Only take over-the-counter or prescription medicines as directed by your caregiver. Over-the-counter medicines to reduce pain and inflammation are often the most helpful. Your caregiver may prescribe muscle relaxant drugs. These medicines help dull your pain so you can more quickly return to your normal activities and healthy exercise. °· Put ice on the injured area. °¨ Put ice in a plastic bag. °¨ Place a towel between your skin and the bag. °¨ Leave the ice on for 15-20 minutes, 03-04 times a day for the first 2 to 3 days. After that, ice and heat may be alternated to reduce pain and spasms. °· Ask your caregiver about trying back exercises and gentle massage. This may be of some benefit. °· Avoid feeling anxious or stressed. Stress increases muscle tension and can worsen back pain. It is important to recognize when you are anxious or stressed and learn ways to manage it. Exercise is a great option. °SEEK MEDICAL CARE IF: °· You have pain that is not relieved with rest or medicine. °· You have pain that does not improve in 1 week. °· You have new symptoms. °· You are generally not feeling well. °SEEK   IMMEDIATE MEDICAL CARE IF:  °· You have pain that radiates from your back into your legs. °· You develop new bowel or bladder control problems. °· You have unusual weakness or numbness in your arms or legs. °· You develop nausea or vomiting. °· You develop abdominal pain. °· You feel faint. °Document Released: 10/25/2005 Document Revised: 04/25/2012 Document Reviewed: 02/26/2014 °ExitCare® Patient Information ©2015 [employer]. This information is not intended to replace advice given to you by your health care provider. Make sure you  discuss any questions you have with your health care provider. °Cryotherapy °Cryotherapy means treatment with cold. Ice or gel packs can be used to reduce both pain and swelling. Ice is the most helpful within the first 24 to 48 hours after an injury or flare-up from overusing a muscle or joint. Sprains, strains, spasms, burning pain, shooting pain, and aches can all be eased with ice. Ice can also be used when recovering from surgery. Ice is effective, has very few side effects, and is safe for most people to use. °PRECAUTIONS  °Ice is not a safe treatment option for people with: °· Raynaud phenomenon. This is a condition affecting small blood vessels in the extremities. Exposure to cold may cause your problems to return. °· Cold hypersensitivity. There are many forms of cold hypersensitivity, including: °¨ Cold urticaria. Red, itchy hives appear on the skin when the tissues begin to warm after being iced. °¨ Cold erythema. This is a red, itchy rash caused by exposure to cold. °¨ Cold hemoglobinuria. Red blood cells break down when the tissues begin to warm after being iced. The hemoglobin that carry oxygen are passed into the urine because they cannot combine with blood proteins fast enough. °· Numbness or altered sensitivity in the area being iced. °If you have any of the following conditions, do not use ice until you have discussed cryotherapy with your caregiver: °· Heart conditions, such as arrhythmia, angina, or chronic heart disease. °· High blood pressure. °· Healing wounds or open skin in the area being iced. °· Current infections. °· Rheumatoid arthritis. °· Poor circulation. °· Diabetes. °Ice slows the blood flow in the region it is applied. This is beneficial when trying to stop inflamed tissues from spreading irritating chemicals to surrounding tissues. However, if you expose your skin to cold temperatures for too long or without the proper protection, you can damage your skin or nerves. Watch for  signs of skin damage due to cold. °HOME CARE INSTRUCTIONS °Follow these tips to use ice and cold packs safely. °· Place a dry or damp towel between the ice and skin. A damp towel will cool the skin more quickly, so you may need to shorten the time that the ice is used. °· For a more rapid response, add gentle compression to the ice. °· Ice for no more than 10 to 20 minutes at a time. The bonier the area you are icing, the less time it will take to get the benefits of ice. °· Check your skin after 5 minutes to make sure there are no signs of a poor response to cold or skin damage. °· Rest 20 minutes or more between uses. °· Once your skin is numb, you can end your treatment. You can test numbness by very lightly touching your skin. The touch should be so light that you do not see the skin dimple from the pressure of your fingertip. When using ice, most people will feel these normal sensations in this order: cold,   burning, aching, and numbness. °· Do not use ice on someone who cannot communicate their responses to pain, such as small children or people with dementia. °HOW TO MAKE AN ICE PACK °Ice packs are the most common way to use ice therapy. Other methods include ice massage, ice baths, and cryosprays. Muscle creams that cause a cold, tingly feeling do not offer the same benefits that ice offers and should not be used as a substitute unless recommended by your caregiver. °To make an ice pack, do one of the following: °· Place crushed ice or a bag of frozen vegetables in a sealable plastic bag. Squeeze out the excess air. Place this bag inside another plastic bag. Slide the bag into a pillowcase or place a damp towel between your skin and the bag. °· Mix 3 parts water with 1 part rubbing alcohol. Freeze the mixture in a sealable plastic bag. When you remove the mixture from the freezer, it will be slushy. Squeeze out the excess air. Place this bag inside another plastic bag. Slide the bag into a pillowcase or place  a damp towel between your skin and the bag. °SEEK MEDICAL CARE IF: °· You develop white spots on your skin. This may give the skin a blotchy (mottled) appearance. °· Your skin turns blue or pale. °· Your skin becomes waxy or hard. °· Your swelling gets worse. °MAKE SURE YOU:  °· Understand these instructions. °· Will watch your condition. °· Will get help right away if you are not doing well or get worse. °Document Released: 06/21/2011 Document Revised: 03/11/2014 Document Reviewed: 06/21/2011 °ExitCare® Patient Information ©2015 [employer]. This information is not intended to replace advice given to you by your health care provider. Make sure you discuss any questions you have with your health care provider. ° °
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
## 2014-07-29 ENCOUNTER — Other Ambulatory Visit (INDEPENDENT_AMBULATORY_CARE_PROVIDER_SITE_OTHER): Payer: No Typology Code available for payment source

## 2014-07-29 DIAGNOSIS — Z8619 Personal history of other infectious and parasitic diseases: Secondary | ICD-10-CM

## 2014-07-29 NOTE — ED Provider Notes (Signed)
Medical screening examination/treatment/procedure(s) were performed by non-physician practitioner and as supervising physician I was immediately available for consultation/collaboration.   EKG Interpretation None        Myracle Febres, MD 07/29/14 0007 

## 2014-07-30 LAB — HEPATITIS C RNA QUANTITATIVE: HCV QUANT: NOT DETECTED [IU]/mL (ref <15)

## 2014-08-14 ENCOUNTER — Telehealth: Payer: Self-pay | Admitting: *Deleted

## 2014-08-14 NOTE — Telephone Encounter (Signed)
Called requesting all his [facility] appts be cancelled due to his new insurance he will need to find new doctors.  His one appt with Kirsteins was cancelled.

## 2014-08-22 ENCOUNTER — Ambulatory Visit: Payer: No Typology Code available for payment source | Admitting: Physical Medicine & Rehabilitation

## 2014-11-25 ENCOUNTER — Encounter: Payer: Medicaid Other | Admitting: Internal Medicine
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
## 2015-05-31 ENCOUNTER — Emergency Department (HOSPITAL_BASED_OUTPATIENT_CLINIC_OR_DEPARTMENT_OTHER)
Admission: EM | Admit: 2015-05-31 | Discharge: 2015-05-31 | Disposition: A | Discharge status: Home / Self Care | Payer: Medicare Other | Attending: Emergency Medicine | Admitting: Emergency Medicine

## 2015-05-31 ENCOUNTER — Encounter (HOSPITAL_BASED_OUTPATIENT_CLINIC_OR_DEPARTMENT_OTHER): Payer: Self-pay | Admitting: *Deleted

## 2015-05-31 DIAGNOSIS — S61212A Laceration without foreign body of right middle finger without damage to nail, initial encounter: Secondary | ICD-10-CM | POA: Diagnosis present

## 2015-05-31 DIAGNOSIS — Y9289 Other specified places as the place of occurrence of the external cause: Secondary | ICD-10-CM | POA: Diagnosis not present

## 2015-05-31 DIAGNOSIS — Y998 Other external cause status: Secondary | ICD-10-CM | POA: Insufficient documentation

## 2015-05-31 DIAGNOSIS — Z8619 Personal history of other infectious and parasitic diseases: Secondary | ICD-10-CM | POA: Diagnosis not present

## 2015-05-31 DIAGNOSIS — Y9389 Activity, other specified: Secondary | ICD-10-CM | POA: Diagnosis not present

## 2015-05-31 DIAGNOSIS — W228XXA Striking against or struck by other objects, initial encounter: Secondary | ICD-10-CM | POA: Insufficient documentation

## 2015-05-31 DIAGNOSIS — S61219A Laceration without foreign body of unspecified finger without damage to nail, initial encounter: Secondary | ICD-10-CM

## 2015-05-31 DIAGNOSIS — Z79899 Other long term (current) drug therapy: Secondary | ICD-10-CM | POA: Insufficient documentation

## 2015-05-31 MED ORDER — IBUPROFEN 400 MG PO TABS
400.0000 mg | ORAL_TABLET | Freq: Once | ORAL | Status: AC
  Administered 2015-05-31: 400 mg via ORAL
  Filled 2015-05-31: qty 1

## 2015-05-31 MED ORDER — LIDOCAINE-EPINEPHRINE-TETRACAINE (LET) SOLUTION
3.0000 mL | Freq: Once | NASAL | Status: AC
  Administered 2015-05-31: 13:00:00 3 mL via TOPICAL
  Filled 2015-05-31: qty 3

## 2015-05-31 NOTE — ED Notes (Addendum)
No active bleeding noted at this time, laceration is irregular in shape

## 2015-05-31 NOTE — ED Notes (Signed)
States tetanus was last rec approx 2 years ago.
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
## 2015-05-31 NOTE — Discharge Instructions (Signed)
Treatment the skin as you normally would, do not apply any thick ointment such as bacitracin or Neosporin, this will remove the tissue adhesive.   Every attempt was made to remove foreign body (contaminants) from the wound.  However, there is always a chance that some may remain in the wound. This can  increase your risk of infection.   If you see signs of infection (warmth, redness, tenderness, pus, sharp increase in pain, fever, red streaking in the skin) immediately return to the emergency department.   After the wound heals fully, apply sunscreen for 6-12 months to minimize scarring.   Please follow with your primary care doctor in the next 2 days for a check-up. They must obtain records for further management.   Do not hesitate to return to the Emergency Department for any new, worsening or concerning symptoms.
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
## 2015-10-26 ENCOUNTER — Emergency Department (HOSPITAL_BASED_OUTPATIENT_CLINIC_OR_DEPARTMENT_OTHER)
Admission: EM | Admit: 2015-10-26 | Discharge: 2015-10-26 | Disposition: A | Discharge status: Home / Self Care | Payer: Medicare Other | Attending: Emergency Medicine | Admitting: Emergency Medicine

## 2015-10-26 ENCOUNTER — Encounter (HOSPITAL_BASED_OUTPATIENT_CLINIC_OR_DEPARTMENT_OTHER): Payer: Self-pay

## 2015-10-26 ENCOUNTER — Emergency Department (HOSPITAL_BASED_OUTPATIENT_CLINIC_OR_DEPARTMENT_OTHER): Payer: Medicare Other

## 2015-10-26 DIAGNOSIS — Z791 Long term (current) use of non-steroidal anti-inflammatories (NSAID): Secondary | ICD-10-CM | POA: Diagnosis not present

## 2015-10-26 DIAGNOSIS — Z8619 Personal history of other infectious and parasitic diseases: Secondary | ICD-10-CM | POA: Diagnosis not present

## 2015-10-26 DIAGNOSIS — R0602 Shortness of breath: Secondary | ICD-10-CM | POA: Diagnosis present

## 2015-10-26 DIAGNOSIS — Z79899 Other long term (current) drug therapy: Secondary | ICD-10-CM | POA: Insufficient documentation

## 2015-10-26 DIAGNOSIS — I10 Essential (primary) hypertension: Secondary | ICD-10-CM | POA: Insufficient documentation

## 2015-10-26 LAB — BASIC METABOLIC PANEL
ANION GAP: 5 (ref 5–15)
BUN: 12 mg/dL (ref 6–20)
CALCIUM: 9.4 mg/dL (ref 8.9–10.3)
CHLORIDE: 104 mmol/L (ref 101–111)
CO2: 28 mmol/L (ref 22–32)
Creatinine, Ser: 0.94 mg/dL (ref 0.61–1.24)
GFR calc non Af Amer: >60 mL/min (ref >60)
Glucose, Bld: 103 mg/dL — ABNORMAL HIGH (ref 65–99)
Potassium: 4.3 mmol/L (ref 3.5–5.1)
Sodium: 137 mmol/L (ref 135–145)

## 2015-10-26 LAB — TROPONIN I

## 2015-10-26 LAB — CBC WITH DIFFERENTIAL/PLATELET
BASOS ABS: 0 10*3/uL (ref 0.0–0.1)
BASOS PCT: 0 %
Eosinophils Absolute: 0.3 10*3/uL (ref 0.0–0.7)
Eosinophils Relative: 7 %
HCT: 40.8 % (ref 39.0–52.0)
HEMOGLOBIN: 13.4 g/dL (ref 13.0–17.0)
Lymphocytes Relative: 26 %
Lymphs Abs: 1.1 10*3/uL (ref 0.7–4.0)
MCH: 28.3 pg (ref 26.0–34.0)
MCHC: 32.8 g/dL (ref 30.0–36.0)
MCV: 86.3 fL (ref 78.0–100.0)
Monocytes Absolute: 0.6 10*3/uL (ref 0.1–1.0)
Monocytes Relative: 15 %
NEUTROS ABS: 2.1 10*3/uL (ref 1.7–7.7)
Neutrophils Relative %: 52 %
Platelets: 245 10*3/uL (ref 150–400)
RBC: 4.73 MIL/uL (ref 4.22–5.81)
RDW: 12.7 % (ref 11.5–15.5)
WBC: 4.1 10*3/uL (ref 4.0–10.5)

## 2015-10-26 LAB — BRAIN NATRIURETIC PEPTIDE: B NATRIURETIC PEPTIDE 5: 28.8 pg/mL (ref 0.0–100.0)

## 2015-10-26 MED ORDER — HYDROCHLOROTHIAZIDE 25 MG PO TABS: 25.0000 mg | ORAL_TABLET | Freq: Every day | ORAL | Status: DC

## 2015-10-26 NOTE — ED Notes (Signed)
MD at bedside. 

## 2015-10-26 NOTE — Discharge Instructions (Signed)
DASH Eating Plan °DASH stands for "Dietary Approaches to Stop Hypertension." The DASH eating plan is a healthy eating plan that has been shown to reduce high blood pressure (hypertension). Additional health benefits may include reducing the risk of type 2 diabetes mellitus, heart disease, and stroke. The DASH eating plan may also help with weight loss. °WHAT DO I NEED TO KNOW ABOUT THE DASH EATING PLAN? °For the DASH eating plan, you will follow these general guidelines: °· Choose foods with a percent daily value for sodium of less than 5% (as listed on the food label). °· Use salt-free seasonings or herbs instead of table salt or sea salt. °· Check with your health care provider or pharmacist before using salt substitutes. °· Eat lower-sodium products, often labeled as "lower sodium" or "no salt added." °· Eat fresh foods. °· Eat more vegetables, fruits, and low-fat dairy products. °· Choose whole grains. Look for the word "whole" as the first word in the ingredient list. °· Choose fish and skinless chicken or turkey more often than red meat. Limit fish, poultry, and meat to 6 oz (170 g) each day. °· Limit sweets, desserts, sugars, and sugary drinks. °· Choose heart-healthy fats. °· Limit cheese to 1 oz (28 g) per day. °· Eat more home-cooked food and less restaurant, buffet, and fast food. °· Limit fried foods. °· Cook foods using methods other than frying. °· Limit canned vegetables. If you do use them, rinse them well to decrease the sodium. °· When eating at a restaurant, ask that your food be prepared with less salt, or no salt if possible. °WHAT FOODS CAN I EAT? °Seek help from a dietitian for individual calorie needs. °Grains °Whole grain or whole wheat bread. Brown rice. Whole grain or whole wheat pasta. Quinoa, bulgur, and whole grain cereals. Low-sodium cereals. Corn or whole wheat flour tortillas. Whole grain cornbread. Whole grain crackers. Low-sodium crackers. °Vegetables °Fresh or frozen vegetables  (raw, steamed, roasted, or grilled). Low-sodium or reduced-sodium tomato and vegetable juices. Low-sodium or reduced-sodium tomato sauce and paste. Low-sodium or reduced-sodium canned vegetables.  °Fruits °All fresh, canned (in natural juice), or frozen fruits. °Meat and Other Protein Products °Ground beef (85% or leaner), grass-fed beef, or beef trimmed of fat. Skinless chicken or turkey. Ground chicken or turkey. Pork trimmed of fat. All fish and seafood. Eggs. Dried beans, peas, or lentils. Unsalted nuts and seeds. Unsalted canned beans. °Dairy °Low-fat dairy products, such as skim or 1% milk, 2% or reduced-fat cheeses, low-fat ricotta or cottage cheese, or plain low-fat yogurt. Low-sodium or reduced-sodium cheeses. °Fats and Oils °Tub margarines without trans fats. Light or reduced-fat mayonnaise and salad dressings (reduced sodium). Avocado. Safflower, olive, or canola oils. Natural peanut or almond butter. °Other °Unsalted popcorn and pretzels. °The items listed above may not be a complete list of recommended foods or beverages. Contact your dietitian for more options. °WHAT FOODS ARE NOT RECOMMENDED? °Grains °White bread. White pasta. White rice. Refined cornbread. Bagels and croissants. Crackers that contain trans fat. °Vegetables °Creamed or fried vegetables. Vegetables in a cheese sauce. Regular canned vegetables. Regular canned tomato sauce and paste. Regular tomato and vegetable juices. °Fruits °Dried fruits. Canned fruit in light or heavy syrup. Fruit juice. °Meat and Other Protein Products °Fatty cuts of meat. Ribs, chicken wings, bacon, sausage, bologna, salami, chitterlings, fatback, hot dogs, bratwurst, and packaged luncheon meats. Salted nuts and seeds. Canned beans with salt. °Dairy °Whole or 2% milk, cream, half-and-half, and cream cheese. Whole-fat or sweetened yogurt. Full-fat   cheeses or blue cheese. Nondairy creamers and whipped toppings. Processed cheese, cheese spreads, or cheese  curds. °Condiments °Onion and garlic salt, seasoned salt, table salt, and sea salt. Canned and packaged gravies. Worcestershire sauce. Tartar sauce. Barbecue sauce. Teriyaki sauce. Soy sauce, including reduced sodium. Steak sauce. Fish sauce. Oyster sauce. Cocktail sauce. Horseradish. Ketchup and mustard. Meat flavorings and tenderizers. Bouillon cubes. Hot sauce. Tabasco sauce. Marinades. Taco seasonings. Relishes. °Fats and Oils °Butter, stick margarine, lard, shortening, ghee, and bacon fat. Coconut, palm kernel, or palm oils. Regular salad dressings. °Other °Pickles and olives. Salted popcorn and pretzels. °The items listed above may not be a complete list of foods and beverages to avoid. Contact your dietitian for more information. °WHERE CAN I FIND MORE INFORMATION? °National [employer], [employer], [employer]: www.nhlbi.nih.gov/health/health-topics/topics/dash/ °  °This information is not intended to replace advice given to you by your health care provider. Make sure you discuss any questions you have with your health care provider. °  °Document Released: 10/14/2011 Document Revised: 11/15/2014 Document Reviewed: 08/29/2013 °Elsevier Interactive Patient Education ©2016 Elsevier Inc. ° °Hypertension °Hypertension, commonly called high blood pressure, is when the force of blood pumping through your arteries is too strong. Your arteries are the blood vessels that carry blood from your heart throughout your body. A blood pressure reading consists of a higher number over a lower number, such as 110/72. The higher number (systolic) is the pressure inside your arteries when your heart pumps. The lower number (diastolic) is the pressure inside your arteries when your heart relaxes. Ideally you want your blood pressure below 120/80. °Hypertension forces your heart to work harder to pump blood. Your arteries may become narrow or stiff. Having untreated or uncontrolled hypertension can cause heart attack, stroke, kidney  disease, and other problems. °RISK FACTORS °Some risk factors for high blood pressure are controllable. Others are not.  °Risk factors you cannot control include:  °· Race. You may be at higher risk if you are African American. °· Age. Risk increases with age. °· Gender. Men are at higher risk than women before age 45 years. After age 65, women are at higher risk than men. °Risk factors you can control include: °· Not getting enough exercise or physical activity. °· Being overweight. °· Getting too much fat, sugar, calories, or salt in your diet. °· Drinking too much alcohol. °SIGNS AND SYMPTOMS °Hypertension does not usually cause signs or symptoms. Extremely high blood pressure (hypertensive crisis) may cause headache, anxiety, shortness of breath, and nosebleed. °DIAGNOSIS °To check if you have hypertension, your health care provider will measure your blood pressure while you are seated, with your arm held at the level of your heart. It should be measured at least twice using the same arm. Certain conditions can cause a difference in blood pressure between your right and left arms. A blood pressure reading that is higher than normal on one occasion does not mean that you need treatment. If it is not clear whether you have high blood pressure, you may be asked to return on a different day to have your blood pressure checked again. Or, you may be asked to monitor your blood pressure at home for 1 or more weeks. °TREATMENT °Treating high blood pressure includes making lifestyle changes and possibly taking medicine. Living a healthy lifestyle can help lower high blood pressure. You may need to change some of your habits. °Lifestyle changes may include: °· Following the DASH diet. This diet is high in fruits, vegetables, and whole   grains. It is low in salt, red meat, and added sugars. °· Keep your sodium intake below 2,300 mg per day. °· Getting at least 30-45 minutes of aerobic exercise at least 4 times per  week. °· Losing weight if necessary. °· Not smoking. °· Limiting alcoholic beverages. °· Learning ways to reduce stress. °Your health care provider may prescribe medicine if lifestyle changes are not enough to get your blood pressure under control, and if one of the following is true: °· You are 18-59 years of age and your systolic blood pressure is above 140. °· You are 60 years of age or older, and your systolic blood pressure is above 150. °· Your diastolic blood pressure is above 90. °· You have diabetes, and your systolic blood pressure is over 140 or your diastolic blood pressure is over 90. °· You have kidney disease and your blood pressure is above 140/90. °· You have heart disease and your blood pressure is above 140/90. °Your personal target blood pressure may vary depending on your medical conditions, your age, and other factors. °HOME CARE INSTRUCTIONS °· Have your blood pressure rechecked as directed by your health care provider.   °· Take medicines only as directed by your health care provider. Follow the directions carefully. Blood pressure medicines must be taken as prescribed. The medicine does not work as well when you skip doses. Skipping doses also puts you at risk for problems. °· Do not smoke.   °· Monitor your blood pressure at home as directed by your health care provider.  °SEEK MEDICAL CARE IF:  °· You think you are having a reaction to medicines taken. °· You have recurrent headaches or feel dizzy. °· You have swelling in your ankles. °· You have trouble with your vision. °SEEK IMMEDIATE MEDICAL CARE IF: °· You develop a severe headache or confusion. °· You have unusual weakness, numbness, or feel faint. °· You have severe chest or abdominal pain. °· You vomit repeatedly. °· You have trouble breathing. °MAKE SURE YOU:  °· Understand these instructions. °· Will watch your condition. °· Will get help right away if you are not doing well or get worse. °  °This information is not intended to  replace advice given to you by your health care provider. Make sure you discuss any questions you have with your health care provider. °  °Document Released: 10/25/2005 Document Revised: 03/11/2015 Document Reviewed: 08/17/2013 °Elsevier Interactive Patient Education ©2016 Elsevier Inc. ° °

## 2015-10-26 NOTE — ED Provider Notes (Signed)
CSN: [id number]     Arrival date & time 10/26/15  0719 History   First MD Initiated Contact with Patient 10/26/15 0732     Chief Complaint  Patient presents with  . Shortness of Breath      HPI Patient presents with chief complaint of shortness of breath when he is lying down at night.  Shortness of breath gets better when he gets up.  Denies any fever chills or cough.  Denies chest pain.  Patient has no history of hypertension. Past Medical History  Diagnosis Date  . Hepatitis C 1990    [facility]  . History of hepatitis C 04/15/2009      S/P pegylated interferon & ribavirin for 12 months.S/P liver biopsy 2006, Dr Melvia Heaps, GI[place] ? [place] 52; diagnosed age 5 @ [facility]    Past Surgical History  Procedure Laterality Date  . Colonoscopy  2003  . Percutaneous liver biopsy  2006    Dr Arlyce Dice  . Tonsillectomy     Family History  Problem Relation Age of Onset  . Peripheral vascular disease Mother   . Alcohol abuse Father 6   Social History  Substance Use Topics  . Smoking status: Never Smoker   . Smokeless tobacco: Never Used  . Alcohol Use: Yes     Comment: beer    Review of Systems  All other systems reviewed and are negative  Allergies  Review of patient's allergies indicates no known allergies.  Home Medications   Prior to Admission medications   Medication Sig Start Date End Date Taking? Authorizing Provider  meloxicam (MOBIC) 15 MG tablet Take 15 mg by mouth daily.   Yes Historical Provider, MD  gabapentin (NEURONTIN) 300 MG capsule Take 1 capsule (300 mg total) by mouth 3 (three) times daily. 07/19/14   Ranelle Oyster, MD  hydrochlorothiazide (HYDRODIURIL) 25 MG tablet Take 1 tablet (25 mg total) by mouth daily. 10/26/15   Nelva Nay, MD   BP 168/105 mmHg  Pulse 58  Temp(Src) 97.8 F (36.6 C) (Oral)  Resp 16  Ht 5' 9.5" (1.765 m)  Wt 180 lb (81.647 kg)  BMI 26.21 kg/m2  SpO2 99% Physical Exam Physical Exam  Nursing note and  vitals reviewed. Constitutional: He is oriented to person, place, and time. He appears well-developed and well-nourished. No distress.  HENT:  Head: Normocephalic and atraumatic.  Eyes: Pupils are equal, round, and reactive to light.  Neck: Normal range of motion.  Cardiovascular: Normal rate and intact distal pulses.   Pulmonary/Chest: No respiratory distress.  Abdominal: Normal appearance. He exhibits no distension.  Musculoskeletal: Normal range of motion.  Neurological: He is alert and oriented to person, place, and time. No cranial nerve deficit.  Skin: Skin is warm and dry. No rash noted.  Psychiatric: He has a normal mood and affect. His behavior is normal.   ED Course  Procedures (including critical care time) Labs Review Labs Reviewed  BASIC METABOLIC PANEL - Abnormal; Notable for the following:    Glucose, Bld 103 (*)    All other components within normal limits  CBC WITH DIFFERENTIAL/PLATELET  TROPONIN I  BRAIN NATRIURETIC PEPTIDE  TROPONIN I  TROPONIN I    Imaging Review Dg Chest 2 View  10/26/2015  CLINICAL DATA:  Increased shortness of Breath EXAM: CHEST - 2 VIEW COMPARISON:  04/17/2009 FINDINGS: The heart size and mediastinal contours are within normal limits. Both lungs are clear. The visualized skeletal structures are unremarkable.  IMPRESSION: No active disease. Electronically Signed   By: Alcide CleverMark  Lukens M.D.   On: 10/26/2015 08:01   I have personally reviewed and evaluated these images and lab results as part of my medical decision-making.   EKG Interpretation   Date/Time:  Sunday October 26 2015 07:47:45 EST Ventricular Rate:  54 PR Interval:  173 QRS Duration: 89 QT Interval:  406 QTC Calculation: 385 R Axis:   -20 Text Interpretation:  Sinus or ectopic atrial rhythm Borderline left axis  deviation Low voltage, precordial leads Borderline T abnormalities,  inferior leads No significant change since last tracing Confirmed by  Kinzi Frediani  MD, Jlyn Bracamonte  ([id number]) on 10/26/2015 7:51:13 AM      MDM   Final diagnoses:  Essential hypertension        Nelva Nayobert Ledon Weihe, MD 10/26/15 [phone]

## 2015-10-26 NOTE — ED Notes (Signed)
Patient here with complaint of shortness of breath. States that when he lays down cant get a good breath and cant rest, no distress, appears congested

## 2017-04-29 ENCOUNTER — Emergency Department (HOSPITAL_BASED_OUTPATIENT_CLINIC_OR_DEPARTMENT_OTHER)
Admission: EM | Admit: 2017-04-29 | Discharge: 2017-04-29 | Disposition: A | Discharge status: Home / Self Care | Payer: Medicare Other | Attending: Physician Assistant | Admitting: Physician Assistant

## 2017-04-29 ENCOUNTER — Encounter (HOSPITAL_BASED_OUTPATIENT_CLINIC_OR_DEPARTMENT_OTHER): Payer: Self-pay

## 2017-04-29 DIAGNOSIS — Y929 Unspecified place or not applicable: Secondary | ICD-10-CM | POA: Diagnosis not present

## 2017-04-29 DIAGNOSIS — Z23 Encounter for immunization: Secondary | ICD-10-CM | POA: Insufficient documentation

## 2017-04-29 DIAGNOSIS — W0110XA Fall on same level from slipping, tripping and stumbling with subsequent striking against unspecified object, initial encounter: Secondary | ICD-10-CM | POA: Diagnosis not present

## 2017-04-29 DIAGNOSIS — S0100XA Unspecified open wound of scalp, initial encounter: Secondary | ICD-10-CM | POA: Diagnosis present

## 2017-04-29 DIAGNOSIS — S0101XA Laceration without foreign body of scalp, initial encounter: Secondary | ICD-10-CM | POA: Diagnosis not present

## 2017-04-29 DIAGNOSIS — Y999 Unspecified external cause status: Secondary | ICD-10-CM | POA: Insufficient documentation

## 2017-04-29 DIAGNOSIS — Y9301 Activity, walking, marching and hiking: Secondary | ICD-10-CM | POA: Diagnosis not present

## 2017-04-29 HISTORY — DX: Opioid abuse, uncomplicated: F11.10

## 2017-04-29 MED ORDER — TETANUS-DIPHTH-ACELL PERTUSSIS 5-2.5-18.5 LF-MCG/0.5 IM SUSP
0.5000 mL | Freq: Once | INTRAMUSCULAR | Status: AC
  Administered 2017-04-29: 0.5 mL via INTRAMUSCULAR
  Filled 2017-04-29: qty 0.5

## 2017-04-29 MED ORDER — LIDOCAINE-EPINEPHRINE (PF) 2 %-1:200000 IJ SOLN
10.0000 mL | Freq: Once | INTRAMUSCULAR | Status: AC
  Administered 2017-04-29: 10 mL
  Filled 2017-04-29: qty 10

## 2017-04-29 NOTE — ED Provider Notes (Signed)
[facility] Note   CSN: [id number] Arrival date & time: 04/29/17  2121  By signing my name below, I, Cynda AcresHailei Fulton, attest that this documentation has been prepared under the direction and in the presence of Audry Piliyler Jonne Rote, PA-C. Electronically Signed: Cynda AcresHailei Fulton, Scribe. 04/29/17. 9:54 PM.  History   Chief Complaint Chief Complaint  Patient presents with  . Fall    HPI Comments: Noah [name] is a 66 y.o. male with no pertinent past medical history, who presents to the Emergency Department complaining of a sudden-onset laceration to the left parietal scalp s/p mechanical fall that occurred earlier tonight at 7:30 pm. Patient states he tripped over a rock and fell, hitting his head. Patient states he sat up and he began profusely bleeding. Patient denies any visual changes, blood thinner use, or loss of consciousness. Patient reports an associated 5/10 headache. Gauze with pressure was applied to control the bleeding. No medications taken prior to arrival. Last tetanus shot was 6 years ago. Patient denies any numbness, tingling, fever, chills, nausea, or vomiting.   The history is provided by the patient. No language interpreter was used.    Past Medical History:  Diagnosis Date  . Hepatitis C 1990   [facility]  . History of hepatitis C 04/15/2009     S/P pegylated interferon & ribavirin for 12 months.S/P liver biopsy 2006, Dr Melvia Heapsobert  [facility], GI. ? [place] 66; diagnosed age 66 @ [facility]   . Nondependent opioid abuse     Patient Active Problem List   Diagnosis Date Noted  . Lumbosacral spondylosis without myelopathy 04/12/2014  . Lumbar radiculopathy 04/12/2014  . Herniated disc 01/24/2014  . Radicular leg pain 01/24/2014  . Lumbar spondylosis 02/15/2012  . Flexor tendon rupture of hand 07/08/2011  . Osteoarthritis of right knee 05/25/2011  . Gilbert's syndrome 05/17/2011  . Nonspecific abnormal electrocardiogram (ECG) (EKG) 05/17/2011  .  History of hepatitis C 04/15/2009    Past Surgical History:  Procedure Laterality Date  . COLONOSCOPY  2003  . PERCUTANEOUS LIVER BIOPSY  2006   Dr Arlyce DiceKaplan  . TONSILLECTOMY         Home Medications    Prior to Admission medications   Not on File    Family History Family History  Problem Relation Age of Onset  . Peripheral vascular disease Mother   . Alcohol abuse Father 7160    Social History Social History  Substance Use Topics  . Smoking status: Never Smoker  . Smokeless tobacco: Never Used  . Alcohol use Yes     Comment: occ     Allergies   Patient has no known allergies.   Review of Systems Review of Systems  Constitutional: Negative for chills and fever.  Eyes: Negative for visual disturbance.  Gastrointestinal: Negative for nausea and vomiting.  Neurological: Positive for headaches. Negative for weakness and numbness.     Physical Exam Updated Vital Signs BP 120/80 (BP Location: Right Arm)   Pulse 65   Temp 98.2 F (36.8 C) (Oral)   Resp 18   Ht 5\' 9"  (1.753 m)   Wt 185 lb 6.5 oz (84.1 kg)   SpO2 98%   BMI 27.38 kg/m   Physical Exam  Constitutional: He is oriented to person, place, and time. Vital signs are normal. He appears well-developed and well-nourished.  HENT:  Head: Normocephalic and atraumatic.  Right Ear: Hearing normal.  Left Ear: Hearing normal.  Mouth/Throat: Oropharynx is clear and  moist.  1 cm linear laceration to the left parietal aspect of the scalp. Bottom of would is visualized. Bleeding is controlled.   Eyes: Conjunctivae and EOM are normal. Pupils are equal, round, and reactive to light.  Neck: Normal range of motion. Neck supple.  Cardiovascular: Normal rate and regular rhythm.   Pulmonary/Chest: Effort normal and breath sounds normal.  Abdominal: Soft. Bowel sounds are normal.  Musculoskeletal: Normal range of motion.  Neurological: He is alert and oriented to person, place, and time.  Skin: Skin is warm and dry.    Psychiatric: He has a normal mood and affect. His speech is normal and behavior is normal. Thought content normal.  Nursing note and vitals reviewed.  ED Treatments / Results  DIAGNOSTIC STUDIES: Oxygen Saturation is 98% on RA, normal by my interpretation.    COORDINATION OF CARE: 9:52 PM Discussed treatment plan with pt at bedside and pt agreed to plan, which includes a tetanus shot and staples.   Labs (all labs ordered are listed, but only abnormal results are displayed) Labs Reviewed - No data to display  EKG  EKG Interpretation None       Radiology No results found.  Procedures .Marland KitchenLaceration Repair Date/Time: 04/29/2017 10:28 PM Performed by: Audry Pili Authorized by: Bary Castilla LYN   Consent:    Consent obtained:  Verbal   Consent given by:  Patient   Risks discussed:  Infection and pain   Alternatives discussed:  No treatment Anesthesia (see MAR for exact dosages):    Anesthesia method:  Local infiltration   Local anesthetic:  Lidocaine 1% WITH epi Laceration details:    Location:  Scalp   Scalp location:  L parietal   Length (cm):  1.5 Repair type:    Repair type:  Simple Pre-procedure details:    Preparation:  Patient was prepped and draped in usual sterile fashion Exploration:    Hemostasis achieved with:  Cautery   Wound exploration: wound explored through full range of motion     Contaminated: no   Treatment:    Area cleansed with:  Betadine   Amount of cleaning:  Standard   Irrigation solution:  Sterile saline   Irrigation method:  Syringe Skin repair:    Repair method:  Staples   Number of staples:  2 Approximation:    Approximation:  Close   Vermilion border: well-aligned   Post-procedure details:    Dressing:  Open (no dressing)   Patient tolerance of procedure:  Tolerated well, no immediate complications   (including critical care time)  Medications Ordered in ED Medications  Tdap (BOOSTRIX) injection 0.5 mL (0.5 mLs  Intramuscular Given 04/29/17 2204)  lidocaine-EPINEPHrine (XYLOCAINE W/EPI) 2 %-1:200000 (PF) injection 10 mL (10 mLs Infiltration Given 04/29/17 2204)     Initial Impression / Assessment and Plan / ED Course  I have reviewed the triage vital signs and the nursing notes.  Pertinent labs & imaging results that were available during my care of the patient were reviewed by me and considered in my medical decision making (see chart for details).   Final Clinical Impressions(s) / ED Diagnoses      {I have reviewed the relevant previous healthcare records.  {I obtained HPI from historian.   ED Course:  Assessment: Patient is a 66 y.o. male that presents with laceration to left parietal scalp due to mechanical fall. Noted head trauma without LOC. No N/V. No visual changes. Tdap booster given. Pressure irrigation performed. Bottom of the wound visualized with  bleeding controled. Laceration occurred < 8 hours prior to repair which was well tolerated. Pt has no co morbidities to effect normal wound healing. Discussed staple home care w pt and answered questions. Pt to f-u for wound check and staple removal in 5-8 days. Pt is hemodynamically stable w no complaints prior to dc.    Disposition/Plan:  DC Home Additional Verbal discharge instructions given and discussed with patient.  Pt Instructed to f/u with PCP in the next week for evaluation and treatment of symptoms. Return precautions given Pt acknowledges and agrees with plan  Supervising Physician Corlis Leak, Cindee Salt, MD   Final diagnoses:  Laceration of scalp, initial encounter    New Prescriptions New Prescriptions   No medications on file    I personally performed the services described in this documentation, which was scribed in my presence. The recorded information has been reviewed and is accurate.    Audry Pili, PA-C 04/29/17 2253    Abelino Derrick, MD 04/29/17 2325

## 2017-04-29 NOTE — Discharge Instructions (Signed)
Please read and follow all provided instructions.  Your diagnoses today include:  1. Laceration of scalp, initial encounter     Tests performed today include: X-ray of the affected area that did not show any foreign bodies or broken bones Vital signs. See below for your results today.   Medications prescribed:   Take any prescribed medications only as directed.   Home care instructions:  Follow any educational materials and wound care instructions contained in this packet.   You may shower and wash the area with soap and water, just be sure to pat the area dry and not rub over the stitches. Do no put your stiches underwater (in a bath, pool, or lake). Getting stiches wet can slow down healing and increase your chances of getting an infection. You may apply Bacitracin or Neosporin twice a day for 7 days, and keep the ara clean with  bandage or gauze. Do not apply alcohol or hydrogen peroxide. Cover the area if it draining or weeping.   Follow-up instructions: Suture Removal: Return to the Emergency Department or see your primary care care doctor in 5-8 days for a recheck of your wound and removal of your sutures or staples.    Return instructions:  Return to the Emergency Department if you have: Fever Worsening pain Worsening swelling of the wound Pus draining from the wound Redness of the skin that moves away from the wound, especially if it streaks away from the affected area  Any other emergent concerns  Your vital signs today were: BP 120/80 (BP Location: Right Arm)    Pulse 65    Temp 98.2 F (36.8 C) (Oral)    Resp 18    Ht 5\' 9"  (1.753 m)    Wt 84.1 kg (185 lb 6.5 oz)    SpO2 98%    BMI 27.38 kg/m  If your blood pressure (BP) was elevated above 135/85 this visit, please have this repeated by your doctor within one month. --------------

## 2017-04-29 NOTE — ED Triage Notes (Addendum)
Pt states he missed a step and fell approx 730p-struck head on a rock-no LOC--lac to left parietal-drove self to ED-NAD-steady gait

## 2017-05-09 ENCOUNTER — Encounter (HOSPITAL_BASED_OUTPATIENT_CLINIC_OR_DEPARTMENT_OTHER): Payer: Self-pay | Admitting: *Deleted

## 2017-05-09 ENCOUNTER — Emergency Department (HOSPITAL_BASED_OUTPATIENT_CLINIC_OR_DEPARTMENT_OTHER)
Admission: EM | Admit: 2017-05-09 | Discharge: 2017-05-09 | Disposition: A | Discharge status: Home / Self Care | Payer: Medicare Other | Attending: Emergency Medicine | Admitting: Emergency Medicine

## 2017-05-09 DIAGNOSIS — W19XXXD Unspecified fall, subsequent encounter: Secondary | ICD-10-CM | POA: Diagnosis not present

## 2017-05-09 DIAGNOSIS — Z4802 Encounter for removal of sutures: Secondary | ICD-10-CM

## 2017-05-09 DIAGNOSIS — S0101XD Laceration without foreign body of scalp, subsequent encounter: Secondary | ICD-10-CM | POA: Insufficient documentation

## 2017-05-09 NOTE — ED Triage Notes (Signed)
Here for staple removal, #2 noted to L parietal, denies pain or other sx, no signs of infection, approximated well. Dr[facility] Nican[facility]conPalumbo [facility]

## 2017-05-09 NOTE — ED Provider Notes (Signed)
[facility] Provider Note   CSN: [id number] Arrival date & time: 05/09/17  0041  By signing my name below, I, Diona BrownerJennifer Gorman, attest that this documentation has been prepared under the direction and in the presence of Cimberly Stoffel, MD. Electronically Signed: Diona BrownerJennifer Gorman, ED Scribe. 05/09/17. 12:46 AM.  History   Chief Complaint Chief Complaint  Patient presents with  . Suture / Staple Removal    HPI Noah [name] is a 66 y.o. male who presents to the Emergency Department with a chief complaint of staple removal to his left parietal scalp. Pt had 1 staple placed on 04/29/17 s/p a fall. He had kept the area clean and dry. Pt denies discharge and fever.   The history is provided by the patient. No language interpreter was used.  Wound Check  This is a new problem. The current episode started more than 1 week ago. The problem occurs constantly. The problem has been rapidly improving. Pertinent negatives include no chest pain and no abdominal pain. Nothing aggravates the symptoms. Nothing relieves the symptoms. He has tried nothing for the symptoms. The treatment provided no relief.    Past Medical History:  Diagnosis Date  . [facility]  . History of hepatitis C 04/15/2009     S/P pegylated interferon & ribavirin for 12 months.S/P liver biopsy 2006, Dr Melvia Heapsobert  [place], GI. ? [place] 66; diagnosed age 66 [facility]   . Nondependent opioid abuse     Patient Active Problem List   Diagnosis Date Noted  . Lumbosacral spondylosis without myelopathy 04/12/2014  . Lumbar radiculopathy 04/12/2014  . Herniated disc 01/24/2014  . Radicular leg pain 01/24/2014  . Lumbar spondylosis 02/15/2012  . Flexor tendon rupture of hand 07/08/2011  . Osteoarthritis of right knee 05/25/2011  . Gilbert's syndrome 05/17/2011  . Nonspecific abnormal electrocardiogram (ECG) (EKG) 05/17/2011  . History of hepatitis C 04/15/2009    Past Surgical History:    Procedure Laterality Date  . COLONOSCOPY  2003  . PERCUTANEOUS LIVER BIOPSY  2006   Dr Arlyce DiceKaplan  . TONSILLECTOMY         Home Medications    Prior to Admission medications   Not on File    Family History Family History  Problem Relation Age of Onset  . Peripheral vascular disease Mother   . Alcohol abuse Father 3360    Social History Social History  Substance Use Topics  . Smoking status: Never Smoker  . Smokeless tobacco: Never Used  . Alcohol use Yes     Comment: occ     Allergies   Patient has no known allergies.   Review of Systems Review of Systems  Constitutional: Negative for fever.  Cardiovascular: Negative for chest pain.  Gastrointestinal: Negative for abdominal pain.  Skin: Positive for wound.  All other systems reviewed and are negative.    Physical Exam Updated Vital Signs BP (!) 128/97 (BP Location: Right Arm)   Pulse 74   Temp 97.8 F (36.6 C) (Oral)   Resp 20   Ht 5\' 9"  (1.753 m)   Wt 185 lb (83.9 kg)   SpO2 97%   BMI 27.32 kg/m   Physical Exam  Constitutional: He is oriented to person, place, and time. He appears well-developed and well-nourished. No distress.  HENT:  Head: Normocephalic. Head is without raccoon's eyes and without Battle's sign.  Mouth/Throat: Oropharynx is clear and moist. No oropharyngeal exudate.  Eyes: Conjunctivae and  EOM are normal. Pupils are equal, round, and reactive to light. Right eye exhibits no discharge. Left eye exhibits no discharge. No scleral icterus.  Neck: Normal range of motion. Neck supple. No JVD present. No tracheal deviation present.  Trachea is midline. No stridor or carotid bruits.  Cardiovascular: Normal rate, regular rhythm, normal heart sounds and intact distal pulses.   No murmur heard. Pulmonary/Chest: Effort normal and breath sounds normal. No stridor. No respiratory distress. He has no wheezes. He has no rales.  Lungs CTA bilaterally.  Abdominal: Soft. Bowel sounds are normal. He  exhibits no distension. There is no tenderness. There is no rebound and no guarding.  Musculoskeletal: Normal range of motion. He exhibits no edema or tenderness.  All compartments are soft. No palpable cords.   Lymphadenopathy:    He has no cervical adenopathy.  Neurological: He is alert and oriented to person, place, and time. He has normal reflexes. He displays normal reflexes.  Skin: Skin is warm and dry. Capillary refill takes less than 2 seconds.  Psychiatric: He has a normal mood and affect. His behavior is normal.  Nursing note and vitals reviewed.    ED Treatments / Results  DIAGNOSTIC STUDIES: Oxygen Saturation is 97% on RA, normal by my interpretation.   COORDINATION OF CARE: 12:46 AM-Discussed next steps with pt. Pt verbalized understanding and is agreeable with the plan.   Procedures .Suture Removal Date/Time: 05/09/2017 12:47 AM Performed by: Cy Blamer Authorized by: Cy Blamer   Consent:    Consent obtained:  Verbal   Consent given by:  Patient   Risks discussed:  Bleeding Location:    Location:  Head/neck   Head/neck location:  Scalp Procedure details:    Wound appearance:  No signs of infection, nonpurulent and clean   Number of staples removed:  1 Post-procedure details:    Post-removal:  No dressing applied   Patient tolerance of procedure:  Tolerated well, no immediate complications   (including critical care time)   Final Clinical Impressions(s) / ED Diagnoses   Final diagnoses:  Removal of staples  Strict return precautions given. Return immediately for fever >101, altered level of consciousness,bleeding or any concerns. Follow up with your own doctor for ongoing concerns.   The patient is nontoxic-appearing on exam and vital signs are within normal limits.   I have reviewed the triage vital signs and the nursing notes. Pertinent labs &imaging results that were available during my care of the patient were reviewed by me and  considered in my medical decision making (see chart for details).  After history, exam, and medical workup I feel the patient has been appropriately medically screened and is safe for discharge home. Pertinent diagnoses were discussed with the patient. Patient was given return precautions.   I personally performed the services described in this documentation, which was scribed in my presence. The recorded information has been reviewed and is accurate.      Ardyn Forge, MD 05/09/17 0981

## 2018-02-24 ENCOUNTER — Emergency Department (HOSPITAL_BASED_OUTPATIENT_CLINIC_OR_DEPARTMENT_OTHER): Payer: Medicare Other

## 2018-02-24 ENCOUNTER — Encounter (HOSPITAL_BASED_OUTPATIENT_CLINIC_OR_DEPARTMENT_OTHER): Payer: Self-pay | Admitting: *Deleted

## 2018-02-24 ENCOUNTER — Emergency Department (HOSPITAL_BASED_OUTPATIENT_CLINIC_OR_DEPARTMENT_OTHER)
Admission: EM | Admit: 2018-02-24 | Discharge: 2018-02-25 | Disposition: A | Discharge status: Home / Self Care | Payer: Medicare Other | Attending: Emergency Medicine | Admitting: Emergency Medicine

## 2018-02-24 ENCOUNTER — Other Ambulatory Visit: Payer: Self-pay

## 2018-02-24 DIAGNOSIS — L02419 Cutaneous abscess of limb, unspecified: Secondary | ICD-10-CM

## 2018-02-24 DIAGNOSIS — L02413 Cutaneous abscess of right upper limb: Secondary | ICD-10-CM | POA: Diagnosis not present

## 2018-02-24 DIAGNOSIS — R2231 Localized swelling, mass and lump, right upper limb: Secondary | ICD-10-CM | POA: Diagnosis present

## 2018-02-24 MED ORDER — LIDOCAINE-EPINEPHRINE (PF) 2 %-1:200000 IJ SOLN
20.0000 mL | Freq: Once | INTRAMUSCULAR | Status: AC
  Administered 2018-02-24: 20 mL
  Filled 2018-02-24: qty 20

## 2018-02-24 NOTE — ED Triage Notes (Signed)
Abscess to his right AC. Admits to IV drug use in the vein. Redness, pain swelling and hot to touch.

## 2018-02-25 MED ORDER — SULFAMETHOXAZOLE-TRIMETHOPRIM 800-160 MG PO TABS: 1.0000 | ORAL_TABLET | Freq: Two times a day (BID) | ORAL | 0 refills | Status: AC

## 2018-02-25 MED ORDER — CEPHALEXIN 500 MG PO CAPS: 500.0000 mg | ORAL_CAPSULE | Freq: Four times a day (QID) | ORAL | 0 refills | Status: AC

## 2018-02-25 NOTE — ED Notes (Signed)
ED Provider at bedside. 

## 2018-02-25 NOTE — Discharge Instructions (Addendum)
Please take Bactrim and Keflex as directed, you may use ibuprofen or Tylenol for pain.  Please stop using IV drugs.  You will need to follow-up with orthopedics for continued management of abscess as we were unable to drain it here in the ED today.  Please call Monday to schedule follow-up appointment.  Return to the ED if you began having fevers or chills, feeling generally unwell, nausea or vomiting, abscess is worsening or you began having numbness tingling or significantly worsened pain in the arm.

## 2018-02-25 NOTE — ED Provider Notes (Signed)
[facility] EMERGENCY DEPARTMENT Provider Note   CSN: [id number] Arrival date & time: 02/24/18  2052     History   Chief Complaint Chief Complaint  Patient presents with  . Abscess    HPI Noah [name] is a 67 y.o. male.  Noah [name] is a 67 y.o. Male with a history of hepatitis C and IV drug use, presents to the ED for evaluation of abscess to the right [facility].  Patient reports 4 days ago he and directed heroin into his right AC, but missed the vein, since then he has had area of swelling, redness warmth and pain.  He reports the area has been getting larger and increasingly painful particularly with palpation or movement.  No meds prior to arrival to treat symptoms, no other aggravating or alleviating factors.  He reports he did not lose the needle within the space.  He denies any fevers or chills, generalized malaise, nausea or vomiting.  No prior history of similar abscesses.  No history of diabetes.  Patient denies any chest pain or shortness of breath.  Tetanus has been updated in the last 2 years.     Past Medical History:  Diagnosis Date  . Hepatitis C 1990   [facility]  . History of hepatitis C 04/15/2009     S/P pegylated interferon & ribavirin for 12 months.S/P liver biopsy 2006, Dr Melvia Heaps, GI. ? [place] 25; diagnosed age 41 [facility]   . Nondependent opioid abuse [facility])     Patient Active Problem List   Diagnosis Date Noted  . Lumbosacral spondylosis without myelopathy 04/12/2014  . Lumbar radiculopathy 04/12/2014  . Herniated disc 01/24/2014  . Radicular leg pain 01/24/2014  . Lumbar spondylosis 02/15/2012  . Flexor tendon rupture of hand 07/08/2011  . Osteoarthritis of right knee 05/25/2011  . Gilbert's syndrome 05/17/2011  . Nonspecific abnormal electrocardiogram (ECG) (EKG) 05/17/2011  . History of hepatitis C 04/15/2009    Past Surgical History:  Procedure Laterality Date  . COLONOSCOPY  2003  . PERCUTANEOUS LIVER  BIOPSY  2006   Dr Arlyce Dice  . TONSILLECTOMY          Home Medications    Prior to Admission medications   Medication Sig Start Date End Date Taking? Authorizing Provider  cephALEXin (KEFLEX) 500 MG capsule Take 1 capsule (500 mg total) by mouth 4 (four) times daily for 7 days. 02/25/18 03/04/18  Dartha Lodge, PA-C  sulfamethoxazole-trimethoprim (BACTRIM DS,SEPTRA DS) 800-160 MG tablet Take 1 tablet by mouth 2 (two) times daily for 7 days. 02/25/18 03/04/18  Dartha Lodge, PA-C    Family History Family History  Problem Relation Age of Onset  . Peripheral vascular disease Mother   . Alcohol abuse Father 10    Social History Social History   Tobacco Use  . Smoking status: Never Smoker  . Smokeless tobacco: Never Used  Substance Use Topics  . Alcohol use: Yes    Comment: occ  . Drug use: Yes    Types: Cocaine    Comment: heroin     Allergies   Patient has no known allergies.   Review of Systems Review of Systems  Constitutional: Negative for chills, fatigue and fever.  Respiratory: Negative for shortness of breath.   Cardiovascular: Negative for chest pain.  Gastrointestinal: Negative for nausea and vomiting.  Musculoskeletal: Negative for arthralgias and joint swelling.  Skin: Positive for color change.       Abscess  Neurological: Negative for weakness and numbness.  All other systems reviewed and are negative.    Physical Exam Updated Vital Signs BP 114/68 (BP Location: Left Arm)   Pulse 61   Temp 98.1 F (36.7 C) (Oral)   Resp 18   Ht 5' 9.5" (1.765 m)   Wt 83.9 kg (185 lb)   SpO2 100%   BMI 26.93 kg/m   Physical Exam  Constitutional: He appears well-developed and well-nourished. No distress.  HENT:  Head: Normocephalic and atraumatic.  Eyes: Right eye exhibits no discharge. Left eye exhibits no discharge.  Neck: Neck supple.  Cardiovascular: Normal rate, regular rhythm, normal heart sounds and intact distal pulses.  Pulmonary/Chest: Effort  normal and breath sounds normal. No stridor. No respiratory distress. He has no wheezes. He has no rales.  Respirations equal and unlabored, patient able to speak in full sentences, lungs clear to auscultation bilaterally  Abdominal: Soft. Bowel sounds are normal. He exhibits no distension. There is no tenderness.  Musculoskeletal:  3 x 2 cm area of induration in the right AC, mild overlying erythema, area is not particularly fluctuant there is no opening or area of drainage, no surrounding cellulitis or streaking, patient is still able to bend and extend the elbow, sensation intact throughout the arm, 2+ radial pulse and good capillary refill.  Neurological: He is alert. Coordination normal.  Skin: Skin is warm and dry. Capillary refill takes less than 2 seconds. He is not diaphoretic.  Psychiatric: He has a normal mood and affect. His behavior is normal.  Nursing note and vitals reviewed.    ED Treatments / Results  Labs (all labs ordered are listed, but only abnormal results are displayed) Labs Reviewed - No data to display  EKG None  Radiology Dg Elbow Complete Right  Result Date: 02/24/2018 CLINICAL DATA:  Intravenous drug use. Patient states he put a needle in toes antecubital fossa on Tuesday and has an abscess tracking through. EXAM: RIGHT ELBOW - COMPLETE 3+ VIEW COMPARISON:  Elbow MRI from 02/24/2015 FINDINGS: No radiopaque foreign body. No bone destruction to suggest osteomyelitis. There is no acute fracture or joint effusion. Well corticated ossification projects along the course of the common flexor tendons possibly representing tendinopathy. No soft tissue emphysema. IMPRESSION: No radiopaque foreign body, fracture or bone destruction. No joint effusion. No soft tissue gas. Electronically Signed   By: Tollie Eth M.D.   On: 02/24/2018 23:28   EMERGENCY DEPARTMENT US SOFT TISSUE INTERPRETATION "Study: Limited Soft Tissue Ultrasound"  INDICATIONS: Soft tissue  infection Multiple views of the body part were obtained in real-time with a multi-frequency linear probe  PERFORMED BY: Myself IMAGES ARCHIVED?: Yes SIDE:Right  BODY PART:Upper extremity INTERPRETATION:  Possible small abscess present, with heterogeneous fluid collection, collection is very superficial with deeper surrounding veins present.     Procedures .Marland KitchenIncision and Drainage Date/Time: 02/25/2018 12:51 AM Performed by: Dartha Lodge, PA-C Authorized by: Dartha Lodge, PA-C   Consent:    Consent obtained:  Verbal   Consent given by:  Patient   Risks discussed:  Bleeding, incomplete drainage, damage to other organs and pain   Alternatives discussed:  No treatment Location:    Type:  Abscess   Size:  2 x 3 cm   Location:  Upper extremity   Upper extremity location:  Elbow (Right antecubital fossa)   Elbow location:  R elbow Anesthesia (see MAR for exact dosages):    Anesthesia method:  Local infiltration   Local anesthetic:  Lidocaine 2% WITH epi Procedure type:    Complexity:  Simple Procedure details:    Incision types:  Single straight   Incision depth:  Dermal   Scalpel blade:  11   Wound management:  Probed and deloculated   Drainage:  Bloody   Drainage amount:  Scant   Packing materials:  None Post-procedure details:    Patient tolerance of procedure:  Tolerated well, no immediate complications   (including critical care time)  Medications Ordered in ED Medications  lidocaine-EPINEPHrine (XYLOCAINE W/EPI) 2 %-1:200000 (PF) injection 20 mL (20 mLs Infiltration Given 02/24/18 2343)     Initial Impression / Assessment and Plan / ED Course  I have reviewed the triage vital signs and the nursing notes.  Pertinent labs & imaging results that were available during my care of the patient were reviewed by me and considered in my medical decision making (see chart for details).  Patient presents for an abscess to the right [facility] after injecting heroin and missing  the vein 4 days ago.  No prior history of similar.  No fever, chills or other systemic symptoms.  On initial evaluation patient with normal vitals and appears to be in no acute distress.  2 x 3 cm area of induration in the right AC without much fluctuance.  Bedside ultrasound utilized and shows potential small pocket of fluid close to the superficial surface with surrounding deeper veins.  Will plan to attempt I&D, tetanus is up-to-date.  Lidocaine for anesthesia.  Good anesthesia achieved with local infiltration.  Small very superficial skin incision made with 11 blade with scant amount of bloody discharge, attempted local de-loculation, but did not achieve any additional drainage.  Given close proximity of veins below will not attempt any further drainage here in the emergency department.  Patient will be placed on Bactrim and Keflex, he will need to follow-up with orthopedics for further management.  At this time he stable for discharge home.  Strict return precautions discussed.  Patient expresses understanding and is in agreement with plan.  Patient seen and evaluated by Dr. Manus Gunning as well who is in agreement with plan.  Final Clinical Impressions(s) / ED Diagnoses   Final diagnoses:  Abscess of antecubital fossa    ED Discharge Orders        Ordered    cephALEXin (KEFLEX) 500 MG capsule  4 times daily     02/25/18 0030    sulfamethoxazole-trimethoprim (BACTRIM DS,SEPTRA DS) 800-160 MG tablet  2 times daily     02/25/18 0030       Dartha Lodge, PA-C 02/25/18 0056    Glynn Octave, MD 02/25/18 [phone]

## 2018-03-06 ENCOUNTER — Ambulatory Visit (INDEPENDENT_AMBULATORY_CARE_PROVIDER_SITE_OTHER): Payer: Medicaid Other | Admitting: Orthopaedic Surgery

## 2018-03-06 ENCOUNTER — Encounter (INDEPENDENT_AMBULATORY_CARE_PROVIDER_SITE_OTHER): Payer: Self-pay | Admitting: Orthopaedic Surgery

## 2018-03-06 ENCOUNTER — Ambulatory Visit (INDEPENDENT_AMBULATORY_CARE_PROVIDER_SITE_OTHER): Payer: Self-pay | Admitting: Orthopaedic Surgery

## 2018-03-06 VITALS — BP 119/72 | HR 69 | Resp 16 | Ht 69.5 in | Wt 180.0 lb

## 2018-03-06 DIAGNOSIS — M79601 Pain in right arm: Secondary | ICD-10-CM

## 2018-03-06 NOTE — Progress Notes (Signed)
Office Visit Note   Patient: Noah [name]           Date of Birth: October 07, 1951           MRN: [id number] Visit Date: 03/06/2018              Requested by: Sanjuana Kava, PA [address], [place] [address] PCP: Sanjuana Kava, [place]   Assessment & Plan: Visit Diagnoses:  1. Pain of right upper extremity     Plan: Puncture wound to antecubital fossa right elbow without retained foreign body.  No present evidence of complication or infection.  Will complete his course of antibiotics and return in 1 week if there are any evidence of increased pain redness or swelling.  Follow-Up Instructions: Return in about 1 week (around 03/13/2018).   Orders:  No orders of the defined types were placed in this encounter.  No orders of the defined types were placed in this encounter.     Procedures: No procedures performed   Clinical Data: No additional findings.   Subjective: No chief complaint on file. Noah [name] relates that he punctured the antecubital fossa of his right elbow with a sewing needle for a month ago.  He was concerned about the persistent swelling and pain and went to the emergency room.  X-rays were apparently negative for any retained foreign body.  He was placed on an antibiotic to be taken 4 times a day and will complete the 10-day course in the next 2 days.  He relates she is not having any fever or chills and having minimal pain.  Came to the office to be sure that there was not a problem.  He is having some "thickening" around where they had to make the small incision apparently to perform an I&D.  He does not have any numbness or tingling.  HPI  Review of Systems   Objective: Vital Signs: There were no vitals taken for this visit.  Physical Exam  Constitutional: He is oriented to person, place, and time. He appears well-developed and well-nourished.  HENT:  Mouth/Throat: Oropharynx is clear and moist.  Eyes: Pupils are equal, round, and reactive to  light. EOM are normal.  Pulmonary/Chest: Effort normal.  Neurological: He is alert and oriented to person, place, and time.  Skin: Skin is warm and dry.  Psychiatric: He has a normal mood and affect. His behavior is normal.    Ortho Exam awake alert and oriented x3.  Comfortable sitting.  There is a small healed incision along the medial aspect of the antecubital fossa right elbow.  It is firm consistent with scar.  There is no fluctuance or redness.  No local tenderness.  No Tinel's.  Full flexion-extension the elbow.  Neurovascular exam intact distally.  No epitrochlear lymph nodes.  Specialty Comments:  No specialty comments available.  Imaging: No results found.   PMFS History: Patient Active Problem List   Diagnosis Date Noted  . Lumbosacral spondylosis without myelopathy 04/12/2014  . Lumbar radiculopathy 04/12/2014  . Herniated disc 01/24/2014  . Radicular leg pain 01/24/2014  . Lumbar spondylosis 02/15/2012  . Flexor tendon rupture of hand 07/08/2011  . Osteoarthritis of right knee 05/25/2011  . Gilbert's syndrome 05/17/2011  . Nonspecific abnormal electrocardiogram (ECG) (EKG) 05/17/2011  . History of hepatitis C 04/15/2009   Past Medical History:  Diagnosis Date  . [facility] C [facility]   [facility]  . History of hepatitis C 04/15/2009     S/P  pegylated interferon & ribavirin for 12 months.S/P liver biopsy 2006, Dr Melvia Heaps, GI. ? [place] 89; diagnosed age 58 [facility]   . Nondependent opioid abuse (H[facility])     Family History  Problem Relation Age of Onset  . Peripheral vascular disease Mother   . Alcohol abuse Father 62    Past Surgical History:  Procedure Laterality Date  . COLONOSCOPY  2003  . PERCUTANEOUS LIVER BIOPSY  2006   Dr Arlyce Dice  . TONSILLECTOMY     Social History   Occupational History  . Occupation: retired  Tobacco Use  . Smoking status: Never Smoker  . Smokeless tobacco: Never Used  Substance and Sexual Activity  . Alcohol  use: Yes    Comment: occ  . Drug use: Yes    Types: Cocaine    Comment: heroin  . Sexual activity: Not on file     Valeria Batman, MD   Note - This record has been created using [employer].  Chart creation errors have been sought, but may not always  have been located. Such creation errors do not reflect on  the standard of medical care.

## 2018-03-11 ENCOUNTER — Emergency Department (HOSPITAL_BASED_OUTPATIENT_CLINIC_OR_DEPARTMENT_OTHER)
Admission: EM | Admit: 2018-03-11 | Discharge: 2018-03-11 | Disposition: A | Discharge status: Home / Self Care | Payer: Medicare Other | Attending: Emergency Medicine | Admitting: Emergency Medicine

## 2018-03-11 ENCOUNTER — Other Ambulatory Visit: Payer: Self-pay

## 2018-03-11 ENCOUNTER — Encounter (HOSPITAL_BASED_OUTPATIENT_CLINIC_OR_DEPARTMENT_OTHER): Payer: Self-pay | Admitting: Emergency Medicine

## 2018-03-11 ENCOUNTER — Emergency Department (HOSPITAL_BASED_OUTPATIENT_CLINIC_OR_DEPARTMENT_OTHER): Payer: Medicare Other

## 2018-03-11 DIAGNOSIS — Y9301 Activity, walking, marching and hiking: Secondary | ICD-10-CM | POA: Insufficient documentation

## 2018-03-11 DIAGNOSIS — Y929 Unspecified place or not applicable: Secondary | ICD-10-CM | POA: Insufficient documentation

## 2018-03-11 DIAGNOSIS — X500XXA Overexertion from strenuous movement or load, initial encounter: Secondary | ICD-10-CM | POA: Diagnosis not present

## 2018-03-11 DIAGNOSIS — S8992XA Unspecified injury of left lower leg, initial encounter: Secondary | ICD-10-CM | POA: Diagnosis present

## 2018-03-11 DIAGNOSIS — Y999 Unspecified external cause status: Secondary | ICD-10-CM | POA: Insufficient documentation

## 2018-03-11 DIAGNOSIS — S8392XA Sprain of unspecified site of left knee, initial encounter: Secondary | ICD-10-CM

## 2018-03-11 MED ORDER — NAPROXEN 500 MG PO TABS: 500.0000 mg | ORAL_TABLET | Freq: Two times a day (BID) | ORAL | 0 refills | Status: DC

## 2018-03-11 NOTE — Discharge Instructions (Signed)
Ice as needed.  No impact activity till better.  Wear brace everytime you are up

## 2018-03-11 NOTE — ED Notes (Signed)
EMT at bedside to place knee immobilizer

## 2018-03-11 NOTE — ED Triage Notes (Signed)
Patient states that he twisted his left knee yesterday and it hurt worse this am

## 2018-03-11 NOTE — ED Provider Notes (Signed)
[facility] EMERGENCY DEPARTMENT Provider Note   CSN: [id number] Arrival date & time: 03/11/18  1204     History   Chief Complaint No chief complaint on file.   HPI Noah [name] is a 67 y.o. male.  The history is provided by the patient.  Knee Pain   This is a new problem. The current episode started yesterday. The problem occurs constantly. The problem has been gradually worsening. The pain is present in the left knee. The quality of the pain is described as aching and constant. The pain is at a severity of 6/10. The pain is moderate. Associated symptoms include stiffness. Pertinent negatives include full range of motion. The symptoms are aggravated by activity and standing. He has tried nothing for the symptoms. The treatment provided no relief. There has been a history of trauma (was walking up the stairs yesterday when his left knee twisted and he hit it on the stair above him). No prior trauma or issues with that knee    Past Medical History:  Diagnosis Date  . Hepatitis C 1990   [facility]  . History of hepatitis C 04/15/2009     S/P pegylated interferon & ribavirin for 12 months.S/P liver biopsy 2006, Dr Melvia Heaps, GI[place] ? [place] 89; diagnosed age 38 [facility]   . Nondependent opioid abuse [facility])     Patient Active Problem List   Diagnosis Date Noted  . Lumbosacral spondylosis without myelopathy 04/12/2014  . Lumbar radiculopathy 04/12/2014  . Herniated disc 01/24/2014  . Radicular leg pain 01/24/2014  . Lumbar spondylosis 02/15/2012  . Flexor tendon rupture of hand 07/08/2011  . Osteoarthritis of right knee 05/25/2011  . Gilbert's syndrome 05/17/2011  . Nonspecific abnormal electrocardiogram (ECG) (EKG) 05/17/2011  . History of hepatitis C 04/15/2009    Past Surgical History:  Procedure Laterality Date  . COLONOSCOPY  2003  . PERCUTANEOUS LIVER BIOPSY  2006   Dr Arlyce Dice  . TONSILLECTOMY          Home Medications    Prior to  Admission medications   Medication Sig Start Date End Date Taking? Authorizing Provider  naproxen (NAPROSYN) 500 MG tablet Take 1 tablet (500 mg total) by mouth 2 (two) times daily. 03/11/18   Gwyneth Sprout, MD    Family History Family History  Problem Relation Age of Onset  . Peripheral vascular disease Mother   . Alcohol abuse Father 20    Social History Social History   Tobacco Use  . Smoking status: Never Smoker  . Smokeless tobacco: Never Used  Substance Use Topics  . Alcohol use: Yes    Comment: on weekends  . Drug use: Not Currently    Types: Cocaine    Comment: heroin     Allergies   Patient has no known allergies.   Review of Systems Review of Systems  Musculoskeletal: Positive for stiffness.  All other systems reviewed and are negative.    Physical Exam Updated Vital Signs BP 127/80 (BP Location: Left Arm)   Pulse 81   Temp 99 F (37.2 C) (Oral)   Resp 18   Ht  (1.753 m)   Wt 81.6 kg (180 lb)   SpO2 100%   BMI 26.58 kg/m   Physical Exam  Constitutional: He is oriented to person, place, and time. He appears well-developed and well-nourished. No distress.  HENT:  Head: Normocephalic and atraumatic.  Eyes: Pupils are equal, round, and reactive to light.  EOM are normal.  Cardiovascular: Normal rate and intact distal pulses.  Pulmonary/Chest: Effort normal.  Musculoskeletal: He exhibits tenderness.       Left knee: He exhibits effusion. He exhibits normal range of motion, no LCL laxity and no MCL laxity. Tenderness found. Lateral joint line and patellar tendon tenderness noted.  Neurological: He is alert and oriented to person, place, and time.  Skin: Skin is warm and dry. Capillary refill takes less than 2 seconds.  Psychiatric: He has a normal mood and affect. His behavior is normal.  Nursing note and vitals reviewed.    ED Treatments / Results  Labs (all labs ordered are listed, but only abnormal results are displayed) Labs Reviewed  - No data to display  EKG None  Radiology Dg Knee Complete 4 Views Left  Result Date: 03/11/2018 CLINICAL DATA:  67 year old male with twisting injury to left knee yesterday while walking up the stairs complaining of lateral pain. EXAM: LEFT KNEE - COMPLETE 4+ VIEW COMPARISON:  No priors. FINDINGS: Four views of the left knee demonstrate no definite acute displaced fracture, subluxation or dislocation. Small suprapatellar joint effusion. IMPRESSION: 1. Small suprapatellar joint effusion. No other radiographic evidence of acute traumatic injury to the bones of the left knee. Electronically Signed   By: Trudie Reed M.D.   On: 03/11/2018 12:49    Procedures Procedures (including critical care time)  Medications Ordered in ED Medications - No data to display   Initial Impression / Assessment and Plan / ED Course  I have reviewed the triage vital signs and the nursing notes.  Pertinent labs & imaging results that were available during my care of the patient were reviewed by me and considered in my medical decision making (see chart for details).     Patient is a 67 year old gentleman presenting today after mechanical injury to his knee yesterday.  He states the knee hurt yesterday but he was unable to bear weight this morning because the pain was more severe.  Since the day is gone on it is started to get a little better.  He just wanted to make sure nothing was broken.  X-ray shows a suprapatellar effusion but no other acute findings.  Patient placed in a knee sleeve given naproxen and follow-up with Ortho if symptoms do not improve.  Final Clinical Impressions(s) / ED Diagnoses   Final diagnoses:  Sprain of left knee, unspecified ligament, initial encounter    ED Discharge Orders        Ordered    naproxen (NAPROSYN) 500 MG tablet  2 times daily     03/11/18 1320       Gwyneth Sprout, MD 03/11/18 1326

## 2018-06-16 ENCOUNTER — Emergency Department (HOSPITAL_BASED_OUTPATIENT_CLINIC_OR_DEPARTMENT_OTHER)
Admission: EM | Admit: 2018-06-16 | Discharge: 2018-06-16 | Disposition: A | Discharge status: Home / Self Care | Payer: Medicare Other | Attending: Emergency Medicine | Admitting: Emergency Medicine

## 2018-06-16 ENCOUNTER — Encounter (HOSPITAL_BASED_OUTPATIENT_CLINIC_OR_DEPARTMENT_OTHER): Payer: Self-pay | Admitting: Adult Health

## 2018-06-16 ENCOUNTER — Emergency Department (HOSPITAL_BASED_OUTPATIENT_CLINIC_OR_DEPARTMENT_OTHER): Payer: Medicare Other

## 2018-06-16 ENCOUNTER — Other Ambulatory Visit: Payer: Self-pay

## 2018-06-16 DIAGNOSIS — M25562 Pain in left knee: Secondary | ICD-10-CM | POA: Diagnosis present

## 2018-06-16 DIAGNOSIS — M25462 Effusion, left knee: Secondary | ICD-10-CM | POA: Diagnosis not present

## 2018-06-16 MED ORDER — LIDOCAINE-EPINEPHRINE (PF) 2 %-1:200000 IJ SOLN
10.0000 mL | Freq: Once | INTRAMUSCULAR | Status: AC
  Administered 2018-06-16: 10 mL
  Filled 2018-06-16 (×2): qty 10

## 2018-06-16 MED ORDER — NAPROXEN 250 MG PO TABS
500.0000 mg | ORAL_TABLET | Freq: Once | ORAL | Status: AC
  Administered 2018-06-16: 500 mg via ORAL
  Filled 2018-06-16: qty 2

## 2018-06-16 MED ORDER — NAPROXEN 500 MG PO TABS: 500.0000 mg | ORAL_TABLET | Freq: Two times a day (BID) | ORAL | 0 refills | Status: DC

## 2018-06-16 NOTE — ED Notes (Signed)
Patient transported to X-ray 

## 2018-06-16 NOTE — ED Notes (Signed)
ED Provider at bedside. 

## 2018-06-16 NOTE — ED Provider Notes (Signed)
[facility] EMERGENCY DEPARTMENT Provider Note   CSN: [id number] Arrival date & time: 06/16/18  1913     History   Chief Complaint Chief Complaint  Patient presents with  . Knee Pain    HPI Noah [name] is a 67 y.o. male.  Patient is a 67 year old male with a history of hepatitis C and nondependent opiate abuse who is presenting today with left knee pain that is worsened over the last 1 week.  He is also noted swelling.  He states similar things have happened in the past and had the knee drained about 2 years ago.  He denies any injury but states he works on his feet all day long.  He denies any fever, redness of the knee.  He has had no recent orthopedic procedures.  The history is provided by the patient.  Knee Pain   This is a recurrent problem. The pain is present in the left knee. The quality of the pain is described as aching, pounding and constant. The pain is at a severity of 7/10. The pain is moderate. Associated symptoms include stiffness. Pertinent negatives include full range of motion. The symptoms are aggravated by activity and standing. He has tried OTC pain medications for the symptoms. The treatment provided no relief. There has been no history of extremity trauma. Family history is significant for no gout.    Past Medical History:  Diagnosis Date  . Hepatitis C 1990   [facility]  . History of hepatitis C 04/15/2009     S/P pegylated interferon & ribavirin for 12 months.S/P liver biopsy 2006, Dr Melvia Heaps, GI. ? [place] 67; diagnosed age 36 @ [facility]   . Nondependent opioid abuse [facility])     Patient Active Problem List   Diagnosis Date Noted  . Lumbosacral spondylosis without myelopathy 04/12/2014  . Lumbar radiculopathy 04/12/2014  . Herniated disc 01/24/2014  . Radicular leg pain 01/24/2014  . Lumbar spondylosis 02/15/2012  . Flexor tendon rupture of hand 07/08/2011  . Osteoarthritis of right knee 05/25/2011  . Gilbert's  syndrome 05/17/2011  . Nonspecific abnormal electrocardiogram (ECG) (EKG) 05/17/2011  . History of hepatitis C 04/15/2009    Past Surgical History:  Procedure Laterality Date  . COLONOSCOPY  2003  . PERCUTANEOUS LIVER BIOPSY  2006   Dr Arlyce Dice  . TONSILLECTOMY          Home Medications    Prior to Admission medications   Medication Sig Start Date End Date Taking? Authorizing Provider  naproxen (NAPROSYN) 500 MG tablet Take 1 tablet (500 mg total) by mouth 2 (two) times daily. 03/11/18   Gwyneth Sprout, MD    Family History Family History  Problem Relation Age of Onset  . Peripheral vascular disease Mother   . Alcohol abuse Father 30    Social History Social History   Tobacco Use  . Smoking status: Never Smoker  . Smokeless tobacco: Never Used  Substance Use Topics  . Alcohol use: Yes    Comment: on weekends  . Drug use: Not Currently    Types: Cocaine    Comment: heroin     Allergies   Patient has no known allergies.   Review of Systems Review of Systems  Musculoskeletal: Positive for stiffness.  All other systems reviewed and are negative.    Physical Exam Updated Vital Signs Pulse 71   Temp 99 F (37.2 C) (Oral)   Resp 18   Ht 5' 9.5" (1.765  m)   Wt 81.6 kg   SpO2 100%   BMI 26.20 kg/m   Physical Exam  Constitutional: He is oriented to person, place, and time. He appears well-developed and well-nourished. No distress.  HENT:  Head: Normocephalic and atraumatic.  Mouth/Throat: Oropharynx is clear and moist.  Eyes: Pupils are equal, round, and reactive to light. Conjunctivae and EOM are normal.  Neck: Normal range of motion. Neck supple.  Cardiovascular: Normal rate and intact distal pulses.  Pulmonary/Chest: Effort normal. No respiratory distress.  Musculoskeletal: Normal range of motion. He exhibits edema and tenderness.       Left knee: He exhibits swelling and effusion. He exhibits normal range of motion, no ecchymosis and no erythema.  Tenderness found. Medial joint line and lateral joint line tenderness noted.  Neurological: He is alert and oriented to person, place, and time.  Skin: Skin is warm and dry. No rash noted. No erythema.  Psychiatric: He has a normal mood and affect. His behavior is normal.  Nursing note and vitals reviewed.    ED Treatments / Results  Labs (all labs ordered are listed, but only abnormal results are displayed) Labs Reviewed - No data to display  EKG None  Radiology Dg Knee Complete 4 Views Left  Result Date: 06/16/2018 CLINICAL DATA:  Left knee swelling for 7 days. EXAM: LEFT KNEE - COMPLETE 4+ VIEW COMPARISON:  None. FINDINGS: No evidence of fracture, dislocation. There is suprapatellar effusion. No evidence of arthropathy or other focal bone abnormality. Soft tissues are unremarkable. IMPRESSION: No acute abnormality.  Suprapatellar effusion identified. Electronically Signed   By: Sherian ReinWei-Chen  Lin M.D.   On: 06/16/2018 19:42    Procedures Procedures (including critical care time)  Medications Ordered in ED Medications  lidocaine-EPINEPHrine (XYLOCAINE W/EPI) 2 %-1:200000 (PF) injection 10 mL (has no administration in time range)  naproxen (NAPROSYN) tablet 500 mg (has no administration in time range)     Initial Impression / Assessment and Plan / ED Course  I have reviewed the triage vital signs and the nursing notes.  Pertinent labs & imaging results that were available during my care of the patient were reviewed by me and considered in my medical decision making (see chart for details).     Patient presenting with nontraumatic effusion of the left knee that is worsening over the last 1 week.  Patient has had similar symptoms in the past.  Low suspicion for septic joint at this time or gout.  There is no erythema but swelling.  No warmth.  Patient is otherwise well-appearing.  X-ray is significant for suprapatellar effusion.  Patient is requesting drainage.  Arthrocentesis  performed with 20 mL's removed.  Patient placed on NSAIDs and to follow-up with orthopedics.  Final Clinical Impressions(s) / ED Diagnoses   Final diagnoses:  Effusion of left knee    ED Discharge Orders         Ordered    naproxen (NAPROSYN) 500 MG tablet  2 times daily     06/16/18 2055           Gwyneth SproutPlunkett, Carie Kapuscinski, MD 06/16/18 2055

## 2018-06-16 NOTE — ED Triage Notes (Signed)
Presents with left knee pain and swelling that began one week ago. Denies chills and fevers. Knee is swollen and painful to ambulate on.

## 2018-09-01 ENCOUNTER — Ambulatory Visit (INDEPENDENT_AMBULATORY_CARE_PROVIDER_SITE_OTHER): Payer: Medicare Other | Admitting: Family Medicine

## 2018-09-01 ENCOUNTER — Encounter: Payer: Self-pay | Admitting: Family Medicine

## 2018-09-01 DIAGNOSIS — M1712 Unilateral primary osteoarthritis, left knee: Secondary | ICD-10-CM | POA: Diagnosis present

## 2018-09-01 MED ORDER — METHYLPREDNISOLONE ACETATE 40 MG/ML IJ SUSP
40.0000 mg | Freq: Once | INTRAMUSCULAR | Status: AC
  Administered 2018-09-01: 40 mg via INTRA_ARTICULAR

## 2018-09-01 NOTE — Patient Instructions (Signed)
You have a flare of mild arthritis of this knee, synovitis. Your pain is due to arthritis. Use ace wrap or compression sleeve to keep the swelling down. These are the different medications you can take for this: Tylenol 500mg  1-2 tabs three times a day for pain. Capsaicin, aspercreme, or biofreeze topically up to four times a day may also help with pain. Some supplements that may help for arthritis: Boswellia extract, curcumin, pycnogenol Aleve 1-2 tabs twice a day with food Cortisone injections are an option - you were given this today. If cortisone injections do not help, there are different types of shots that may help but they take longer to take effect. It's important that you continue to stay active. Straight leg raises, knee extensions 3 sets of 10 once a day (add ankle weight if these become too easy). Consider physical therapy to strengthen muscles around the joint that hurts to take pressure off of the joint itself. Shoe inserts with good arch support may be helpful. Ice 15 minutes at a time 3-4 times a day as needed to help with pain. Water aerobics and cycling with low resistance are the best two types of exercise for arthritis though any exercise is ok as long as it doesn't worsen the pain. Follow up with me in 1 month.

## 2018-09-04 ENCOUNTER — Encounter: Payer: Self-pay | Admitting: Family Medicine

## 2018-09-04 NOTE — Progress Notes (Signed)
PCP: Sanjuana Kava, PA  Subjective:   HPI: Patient is a 67 y.o. male here for left knee pain.  Patient reports for several weeks he's had problems with his left knee. Pain previously would come and go but has become more consistent. Pain sharp at 7/10 level with associated swelling. No new injury and not taking medication for this. He had knee drained early august which helped. No skin changes, numbness. No giving out, locking.  Past Medical History:  Diagnosis Date  . Hepatitis C 1990   [facility]  . History of hepatitis C 04/15/2009     S/P pegylated interferon & ribavirin for 12 months.S/P liver biopsy 2006, Dr Melvia Heaps, GI. ? [place] 35; diagnosed age 1 @ [facility]   . Nondependent opioid abuse [facility])     Current Outpatient Medications on File Prior to Visit  Medication Sig Dispense Refill  . fluticasone (FLONASE) 50 MCG/ACT nasal spray SHAKE LIQUID AND USE 2 SPRAYS IN EACH NOSTRIL DAILY    . Olopatadine HCl 0.2 % SOLN INT 1 GTT OU QD  3   No current facility-administered medications on file prior to visit.     Past Surgical History:  Procedure Laterality Date  . COLONOSCOPY  2003  . PERCUTANEOUS LIVER BIOPSY  2006   Dr Arlyce Dice  . TONSILLECTOMY      Allergies  Allergen Reactions  . Bupropion Rash  . Citalopram Rash  . Lamotrigine Rash    Social History   Socioeconomic History  . Marital status: Single    Spouse name: Not on file  . Number of children: 2  . Years of education: Not on file  . Highest education level: Not on file  Occupational History  . Occupation: retired  Engineer, production  . Financial resource strain: Not on file  . Food insecurity:    Worry: Not on file    Inability: Not on file  . Transportation needs:    Medical: Not on file    Non-medical: Not on file  Tobacco Use  . Smoking status: Never Smoker  . Smokeless tobacco: Never Used  Substance and Sexual Activity  . Alcohol use: Yes    Comment: on weekends  . Drug  use: Not Currently    Types: Cocaine    Comment: heroin  . Sexual activity: Not on file  Lifestyle  . Physical activity:    Days per week: Not on file    Minutes per session: Not on file  . Stress: Not on file  Relationships  . Social connections:    Talks on phone: Not on file    Gets together: Not on file    Attends religious service: Not on file    Active member of club or organization: Not on file    Attends meetings of clubs or organizations: Not on file    Relationship status: Not on file  . Intimate partner violence:    Fear of current or ex partner: Not on file    Emotionally abused: Not on file    Physically abused: Not on file    Forced sexual activity: Not on file  Other Topics Concern  . Not on file  Social History Narrative   Lives by himself    Family History  Problem Relation Age of Onset  . Peripheral vascular disease Mother   . Alcohol abuse Father 60    BP 116/78   Pulse 80   Ht 5\' 10"  (1.778 m)  Wt 180 lb (81.6 kg)   BMI 25.83 kg/m   Review of Systems: See HPI above.     Objective:  Physical Exam:  Gen: NAD, comfortable in exam room  Left knee: Mild effusion.  No other gross deformity, ecchymoses. TTP medial joint line.  No other tenderness. FROM with 5/5 strength flexion and extension. Negative ant/post drawers. Negative valgus/varus testing. Negative lachmans. Negative mcmurrays, apleys, patellar apprehension. NV intact distally.  Right knee: No deformity. FROM with 5/5 strength. No tenderness to palpation. NVI distally.   Assessment & Plan:  1. Left knee pain - consistent with flare of arthritis and synovitis.  Given steroid injection today.  Tylenol, topical medications, aleve, supplements reviewed.  Shown home exercises.  Icing.  F/u in 1 month.  After informed written consent timeout was performed, patient was lying supine on exam table. Left knee was prepped with alcohol swab and utilizing superolateral approach with  ultrasound guidance, patient's left knee was injected intraarticularly with 3:1 bupivicaine: depomedrol. Patient tolerated the procedure well without immediate complications.

## 2018-09-25 ENCOUNTER — Ambulatory Visit: Payer: Medicare Other | Admitting: Family Medicine

## 2018-10-02 ENCOUNTER — Ambulatory Visit (INDEPENDENT_AMBULATORY_CARE_PROVIDER_SITE_OTHER): Payer: Medicare Other | Admitting: Family Medicine

## 2018-10-02 ENCOUNTER — Encounter: Payer: Self-pay | Admitting: Family Medicine

## 2018-10-02 VITALS — BP 101/76 | HR 112 | Ht 70.0 in | Wt 180.0 lb

## 2018-10-02 DIAGNOSIS — M6208 Separation of muscle (nontraumatic), other site: Secondary | ICD-10-CM | POA: Diagnosis not present

## 2018-10-02 DIAGNOSIS — M1712 Unilateral primary osteoarthritis, left knee: Secondary | ICD-10-CM | POA: Diagnosis present

## 2018-10-02 MED ORDER — MELOXICAM 15 MG PO TABS: 15.0000 mg | ORAL_TABLET | Freq: Every day | ORAL | 2 refills | Status: DC

## 2018-10-02 NOTE — Patient Instructions (Addendum)
You have a diastasis recti of your abdominal wall. Focus on strengthening exercises of your abdominals and obliques. Consider physical therapy. Follow up with me as needed.  Your pain is due to arthritis. Use ace wrap or compression sleeve to keep the swelling down. These are the different medications you can take for this: Tylenol 500mg  1-2 tabs three times a day for pain. Capsaicin, aspercreme, or biofreeze topically up to four times a day may also help with pain. Some supplements that may help for arthritis: Boswellia extract, curcumin, pycnogenol Meloxicam 15mg  daily with food for pain and inflammation as needed. If cortisone injections do not help, there are different types of shots that may help but they take longer to take effect. It's important that you continue to stay active. Straight leg raises, knee extensions 3 sets of 10 once a day (add ankle weight if these become too easy). Consider physical therapy to strengthen muscles around the joint that hurts to take pressure off of the joint itself. Shoe inserts with good arch support may be helpful. Ice 15 minutes at a time 3-4 times a day as needed to help with pain. Water aerobics and cycling with low resistance are the best two types of exercise for arthritis though any exercise is ok as long as it doesn't worsen the pain. Follow up with me as needed if you're doing well.

## 2018-10-02 NOTE — Progress Notes (Signed)
PCP: Sanjuana Kavaeeds, Ryan, PA  Subjective:   HPI: Patient is a 67 y.o. male here for left knee pain, abdominal pain  Patient for follow-up for left knee pain secondary to arthritis.  He reports occasional 3/10 pain.  He notes getting significant relief from the steroid injection.  He is not requiring any medication for his knee.  He notes stiffness mostly in the morning but improves throughout the day while at work.  He works as a Administratorlandscaper.  Notes mild continued swelling but overall is significantly improved as well.  No erythema or bruising.  No associated skin changes or numbness and tingling  He also reports a mass in his abdomen is been there "quite some time".  He has noticed them over the last 2 weeks after work-up.  The mass is midline and increases with straining/Valsalva.  He denies any associated bowel symptoms.  No nausea or vomiting.  He denies any severe pain or inability to reduce the mass.  Pain is a soreness locally worse by end of day at work.  Past Medical History:  Diagnosis Date  . [facility]  . History of hepatitis C 04/15/2009     S/P pegylated interferon & ribavirin for 12 months.S/P liver biopsy 2006, Dr Melvia Heapsobert  [facility], [facility]. ? [place] 67; diagnosed age 67 @ [facility]   . Nondependent opioid abuse [facility])     Current Outpatient Medications on File Prior to Visit  Medication Sig Dispense Refill  . fluticasone (FLONASE) 50 MCG/ACT nasal spray SHAKE LIQUID AND USE 2 SPRAYS IN EACH NOSTRIL DAILY    . Olopatadine HCl 0.2 % SOLN INT 1 GTT OU QD  3   No current facility-administered medications on file prior to visit.     Past Surgical History:  Procedure Laterality Date  . COLONOSCOPY  2003  . PERCUTANEOUS LIVER BIOPSY  2006   Dr Arlyce DiceKaplan  . TONSILLECTOMY      Allergies  Allergen Reactions  . Bupropion Rash  . Citalopram Rash  . Lamotrigine Rash    Social History   Socioeconomic History  . Marital status: Single    Spouse name: Not on  file  . Number of children: 2  . Years of education: Not on file  . Highest education level: Not on file  Occupational History  . Occupation: retired  Engineer, productionocial Needs  . Financial resource strain: Not on file  . Food insecurity:    Worry: Not on file    Inability: Not on file  . Transportation needs:    Medical: Not on file    Non-medical: Not on file  Tobacco Use  . Smoking status: Never Smoker  . Smokeless tobacco: Never Used  Substance and Sexual Activity  . Alcohol use: Yes    Comment: on weekends  . Drug use: Not Currently    Types: Cocaine    Comment: heroin  . Sexual activity: Not on file  Lifestyle  . Physical activity:    Days per week: Not on file    Minutes per session: Not on file  . Stress: Not on file  Relationships  . Social connections:    Talks on phone: Not on file    Gets together: Not on file    Attends religious service: Not on file    Active member of club or organization: Not on file    Attends meetings of clubs or organizations: Not on file    Relationship status: Not  on file  . Intimate partner violence:    Fear of current or ex partner: Not on file    Emotionally abused: Not on file    Physically abused: Not on file    Forced sexual activity: Not on file  Other Topics Concern  . Not on file  Social History Narrative   Lives by himself    Family History  Problem Relation Age of Onset  . Peripheral vascular disease Mother   . Alcohol abuse Father 60    BP 101/76   Pulse (!) 112   Ht 5\' 10"  (1.778 m)   Wt 180 lb (81.6 kg)   BMI 25.83 kg/m   Review of Systems: See HPI above.     Objective:  Physical Exam:  Gen: awake, alert, NAD, comfortable in exam room Pulm: breathing unlabored Abdomen: Soft.  There is a large, > 10 cm easily reducible midline mass between the umbilicus and xiphoid process.  It is somewhat visible at rest and increased with Valsalva.  It is minimally tender to palpation.  Left knee: - Inspection: no gross  deformity.  Trace effusion, no erythema or bruising. Skin intact - Palpation: Mild tenderness of the medial joint line - ROM: full active ROM with flexion and extension in knee and hip - Strength: 5/5 strength - Neuro/vasc: NV intact - Special Tests: - LIGAMENTS: negative anterior and posterior drawer, negative Lachman's, no MCL or LCL laxity  - MENISCUS: negative McMurray's   Assessment & Plan:  1.  Left knee pain-secondary to osteoarthritis.  Improved following steroid injection last visit. - We will start on meloxicam 15 mg daily as needed - May use Tylenol, topical treatments, or supplements for arthritis - Quadricep strengthening - Follow-up as needed  2.  Diastasis recti- no physical exam findings concerning for strangulation/incarceration.  No associated GI symptoms - Recommend abdominal strengthening - May need physical therapy - We reassured patient - he would like to see general surgeon - Follow-up as needed

## 2018-10-03 NOTE — Addendum Note (Signed)
Addended by: Kathi SimpersWISE, Isadora Delorey F on: 10/03/2018 08:48 AM   Modules accepted: Orders

## 2018-10-04 ENCOUNTER — Other Ambulatory Visit: Payer: Self-pay | Admitting: *Deleted

## 2018-10-04 MED ORDER — MELOXICAM 15 MG PO TABS: 15.0000 mg | ORAL_TABLET | Freq: Every day | ORAL | 2 refills | Status: DC

## 2019-01-04 ENCOUNTER — Other Ambulatory Visit: Payer: Self-pay

## 2019-01-04 ENCOUNTER — Encounter (HOSPITAL_BASED_OUTPATIENT_CLINIC_OR_DEPARTMENT_OTHER): Payer: Self-pay | Admitting: Emergency Medicine

## 2019-01-04 ENCOUNTER — Emergency Department (HOSPITAL_BASED_OUTPATIENT_CLINIC_OR_DEPARTMENT_OTHER): Payer: Medicare Other

## 2019-01-04 ENCOUNTER — Emergency Department (HOSPITAL_BASED_OUTPATIENT_CLINIC_OR_DEPARTMENT_OTHER)
Admission: EM | Admit: 2019-01-04 | Discharge: 2019-01-04 | Disposition: A | Discharge status: Home / Self Care | Payer: Medicare Other | Attending: Emergency Medicine | Admitting: Emergency Medicine

## 2019-01-04 DIAGNOSIS — Z79899 Other long term (current) drug therapy: Secondary | ICD-10-CM | POA: Insufficient documentation

## 2019-01-04 DIAGNOSIS — R079 Chest pain, unspecified: Secondary | ICD-10-CM | POA: Diagnosis present

## 2019-01-04 DIAGNOSIS — R072 Precordial pain: Secondary | ICD-10-CM | POA: Diagnosis not present

## 2019-01-04 LAB — CBC WITH DIFFERENTIAL/PLATELET
Abs Immature Granulocytes: 0.02 10*3/uL (ref 0.00–0.07)
Basophils Absolute: 0 10*3/uL (ref 0.0–0.1)
Basophils Relative: 1 %
Eosinophils Absolute: 0.4 10*3/uL (ref 0.0–0.5)
Eosinophils Relative: 5 %
HCT: 43.9 % (ref 39.0–52.0)
Hemoglobin: 13.5 g/dL (ref 13.0–17.0)
Immature Granulocytes: 0 %
Lymphocytes Relative: 23 %
Lymphs Abs: 1.7 10*3/uL (ref 0.7–4.0)
MCH: 27.2 pg (ref 26.0–34.0)
MCHC: 30.8 g/dL (ref 30.0–36.0)
MCV: 88.3 fL (ref 80.0–100.0)
Monocytes Absolute: 0.6 10*3/uL (ref 0.1–1.0)
Monocytes Relative: 8 %
NEUTROS ABS: 4.9 10*3/uL (ref 1.7–7.7)
Neutrophils Relative %: 63 %
Platelets: 282 10*3/uL (ref 150–400)
RBC: 4.97 MIL/uL (ref 4.22–5.81)
RDW: 12.9 % (ref 11.5–15.5)
WBC: 7.5 10*3/uL (ref 4.0–10.5)
nRBC: 0 % (ref 0.0–0.2)

## 2019-01-04 LAB — BASIC METABOLIC PANEL
Anion gap: 7 (ref 5–15)
BUN: 18 mg/dL (ref 8–23)
CO2: 27 mmol/L (ref 22–32)
Calcium: 9 mg/dL (ref 8.9–10.3)
Chloride: 102 mmol/L (ref 98–111)
Creatinine, Ser: 0.93 mg/dL (ref 0.61–1.24)
GFR calc Af Amer: >60 mL/min (ref >60)
GFR calc non Af Amer: >60 mL/min (ref >60)
Glucose, Bld: 92 mg/dL (ref 70–99)
POTASSIUM: 4.2 mmol/L (ref 3.5–5.1)
Sodium: 136 mmol/L (ref 135–145)

## 2019-01-04 LAB — TROPONIN I

## 2019-01-04 LAB — ETHANOL: Alcohol, Ethyl (B): <10 mg/dL (ref <10)

## 2019-01-04 MED ORDER — ASPIRIN 81 MG PO CHEW
324.0000 mg | CHEWABLE_TABLET | Freq: Once | ORAL | Status: AC
  Administered 2019-01-04: 324 mg via ORAL
  Filled 2019-01-04: qty 4

## 2019-01-04 MED ORDER — ALUM & MAG HYDROXIDE-SIMETH 200-200-20 MG/5ML PO SUSP
30.0000 mL | Freq: Once | ORAL | Status: AC
  Administered 2019-01-04: 30 mL via ORAL
  Filled 2019-01-04: qty 30

## 2019-01-04 NOTE — ED Provider Notes (Signed)
Prior to discharge, patient walked to the bathroom and reports he had onset of chest burning symptoms.  He reports burning up and down into his throat but no other acute symptoms.  He now reports this occurs about every morning when he wakes up and stands up out of bed.  He reports it reminds him when he used to drink [place] liquor it would burn his esophagus. He is in no acute distress, he is taking p.o. fluids and reports now is feeling improved. Advised him to start a PPI as an outpatient, given information for Prilosec. He is well-appearing, no acute distress and will be discharged   Zadie Rhine, MD 01/04/19 [phone]

## 2019-01-04 NOTE — Discharge Instructions (Addendum)

## 2019-01-04 NOTE — ED Triage Notes (Signed)
Patient co mid sternal chest pain onset 2 weeks ago; states onset after cleaning the oven; states ate food that was cooked in oven and thinks oven cleaner residue left in oven. NAD noted; ambulatory with steady gait.

## 2019-01-04 NOTE — ED Provider Notes (Signed)
[facility] EMERGENCY DEPARTMENT Provider Note   CSN: [id number] Arrival date & time: 01/04/19  0216    History   Chief Complaint Chief Complaint  Patient presents with  . Chest Pain    HPI Noah [name] is a 68 y.o. male.  HPI: A 67 year old patient presents for evaluation of chest pain. Initial onset of pain was more than 6 hours ago. The patient's chest pain is well-localized, is sharp and is not worse with exertion. The patient's chest pain is middle- or left-sided, is not described as heaviness/pressure/tightness and does not radiate to the arms/jaw/neck. The patient does not complain of nausea and denies diaphoresis. The patient has no history of stroke, has no history of peripheral artery disease, has not smoked in the past 90 days, denies any history of treated diabetes, has no relevant family history of coronary artery disease (first degree relative at less than age 62), is not hypertensive, has no history of hypercholesterolemia and does not have an elevated BMI (>=30).   The history is provided by the patient.  Chest Pain  Relieved by:  Nothing Ineffective treatments:  None tried Associated symptoms: no abdominal pain, no fever, no shortness of breath and no vomiting   Risk factors: male sex   Risk factors: no coronary artery disease, no diabetes mellitus, no high cholesterol, no hypertension and no smoking    Patient reports that 2 weeks ago he cleaned his oven with an oven cleaner.  Soon after he ate food that was closed in the oven and since that time he has had episodes of chest pain.  He is convinced that the chest pain is caused by oven cleaner No pleuritic pain   Past Medical History:  Diagnosis Date  . Hepatitis C 1990   [facility]  . History of hepatitis C 04/15/2009     S/P pegylated interferon & ribavirin for 12 months.S/P liver biopsy 2006, Dr Melvia Heaps, GI[place] ? [place] 47; diagnosed age 89 [facility]   . Nondependent opioid  abuse [facility])     Patient Active Problem List   Diagnosis Date Noted  . Lumbosacral spondylosis without myelopathy 04/12/2014  . Lumbar radiculopathy 04/12/2014  . Herniated disc 01/24/2014  . Radicular leg pain 01/24/2014  . Lumbar spondylosis 02/15/2012  . Flexor tendon rupture of hand 07/08/2011  . Osteoarthritis of right knee 05/25/2011  . Gilbert's syndrome 05/17/2011  . Nonspecific abnormal electrocardiogram (ECG) (EKG) 05/17/2011  . History of hepatitis C 04/15/2009    Past Surgical History:  Procedure Laterality Date  . COLONOSCOPY  2003  . PERCUTANEOUS LIVER BIOPSY  2006   Dr Arlyce Dice  . TONSILLECTOMY          Home Medications    Prior to Admission medications   Medication Sig Start Date End Date Taking? Authorizing Provider  Olopatadine HCl 0.2 % SOLN INT 1 GTT OU QD 10/23/18  Yes [provider]  fluticasone (FLONASE) 50 MCG/ACT nasal spray SHAKE LIQUID AND USE 2 SPRAYS IN EACH NOSTRIL DAILY 09/19/17   [provider]  meloxicam (MOBIC) 15 MG tablet Take 1 tablet (15 mg total) by mouth daily. 10/04/18   Lenda Kelp, MD  Olopatadine HCl 0.2 % SOLN INT 1 GTT OU QD 08/18/18   [provider]    Family History Family History  Problem Relation Age of Onset  . Peripheral vascular disease Mother   . Alcohol abuse Father 84    Social History Social  History   Tobacco Use  . Smoking status: Never Smoker  . Smokeless tobacco: Never Used  Substance Use Topics  . Alcohol use: Yes    Comment: on weekends  . Drug use: Not Currently    Types: Cocaine    Comment: heroin     Allergies   Bupropion; Citalopram; and Lamotrigine   Review of Systems Review of Systems  Constitutional: Negative for fever.  Respiratory: Negative for shortness of breath.   Cardiovascular: Positive for chest pain.  Gastrointestinal: Negative for abdominal pain and vomiting.  All other systems reviewed and are negative.    Physical Exam Updated  Vital Signs BP 117/75   Pulse 64   Temp 97.8 F (36.6 C) (Oral)   Resp 10   Ht 1.753 m (5\' 9" )   Wt 81.6 kg   SpO2 98%   BMI 26.58 kg/m   Physical Exam CONSTITUTIONAL: Mildly disheveled, no acute distress HEAD: Normocephalic/atraumatic EYES: EOMI/PERRL ENMT: Mucous membranes moist NECK: supple no meningeal signs SPINE/BACK:entire spine nontender CV: S1/S2 noted, no murmurs/rubs/gallops noted LUNGS: Lungs are clear to auscultation bilaterally, no apparent distress Chest-no tenderness noted ABDOMEN: soft, nontender, no rebound or guarding, bowel sounds noted throughout abdomen GU:no cva tenderness NEURO: Pt is awake/alert/appropriate, moves all extremitiesx4.  No facial droop.   EXTREMITIES: pulses normal/equalx4, full ROM, no lower extremity edema SKIN: warm, color normal PSYCH: no abnormalities of mood noted, alert and oriented to situation   ED Treatments / Results  Labs (all labs ordered are listed, but only abnormal results are displayed) Labs Reviewed  BASIC METABOLIC PANEL  CBC WITH DIFFERENTIAL/PLATELET  TROPONIN I  ETHANOL  TROPONIN I    EKG EKG Interpretation  Date/Time:  Thursday January 04 2019 02:23:05 EST Ventricular Rate:  88 PR Interval:    QRS Duration: 93 QT Interval:  355 QTC Calculation: 430 R Axis:   -41 Text Interpretation:  Sinus rhythm Probable left atrial enlargement Left axis deviation Low voltage, precordial leads Abnormal R-wave progression, late transition Borderline T wave abnormalities Interpretation limited secondary to artifact No significant change since last tracing Confirmed by Zadie Rhine ([id number]) on 01/04/2019 2:26:52 AM   Radiology Dg Chest 2 View  Result Date: 01/04/2019 CLINICAL DATA:  Chest pain for 2 weeks. EXAM: CHEST - 2 VIEW COMPARISON:  Chest radiograph October 26, 2015 FINDINGS: Cardiomediastinal silhouette is normal. No pleural effusions or focal consolidations. Trachea projects midline and there is no  pneumothorax. Soft tissue planes and included osseous structures are non-suspicious. IMPRESSION: Negative. Electronically Signed   By: Awilda Metro M.D.   On: 01/04/2019 03:14    Procedures Procedures   Medications Ordered in ED Medications  aspirin chewable tablet 324 mg (324 mg Oral Given 01/04/19 0322)  alum & mag hydroxide-simeth (MAALOX/MYLANTA) 200-200-20 MG/5ML suspension 30 mL (30 mLs Oral Given 01/04/19 0415)     Initial Impression / Assessment and Plan / ED Course  I have reviewed the triage vital signs and the nursing notes.  Pertinent labs & imaging results that were available during my care of the patient were reviewed by me and considered in my medical decision making (see chart for details).     HEAR Score: 2  3:51 AM Pt reports CP after possibly ingesting oven cleaner 2 weeks ago Denies any painful swallowing He is well appearing Denies pleuritic CP  4:01 AM Patient reports some return of pain, would like to try medicine for reflux.  Initial testing is negative. Will get repeat troponin 6:11 AM Patient  with a heart score of 2 with 2 negative troponins.  He is in no acute distress.  He has been resting comfortably writing notes throughout his ER stay. No hypoxia, no pleuritic pain to suggest PE. Will discharge home.  We discussed strict ER return precautions Final Clinical Impressions(s) / ED Diagnoses   Final diagnoses:  Precordial pain    ED Discharge Orders    None       Zadie Rhine, MD 01/04/19 [phone]

## 2019-01-26 ENCOUNTER — Other Ambulatory Visit: Payer: Self-pay

## 2019-01-26 ENCOUNTER — Emergency Department (HOSPITAL_BASED_OUTPATIENT_CLINIC_OR_DEPARTMENT_OTHER)
Admission: EM | Admit: 2019-01-26 | Discharge: 2019-01-26 | Disposition: A | Discharge status: Home / Self Care | Payer: Medicare Other | Attending: Emergency Medicine | Admitting: Emergency Medicine

## 2019-01-26 ENCOUNTER — Encounter (HOSPITAL_BASED_OUTPATIENT_CLINIC_OR_DEPARTMENT_OTHER): Payer: Self-pay | Admitting: *Deleted

## 2019-01-26 DIAGNOSIS — R079 Chest pain, unspecified: Secondary | ICD-10-CM | POA: Diagnosis not present

## 2019-01-26 LAB — CBC
HCT: 44.2 % (ref 39.0–52.0)
Hemoglobin: 13.6 g/dL (ref 13.0–17.0)
MCH: 27.4 pg (ref 26.0–34.0)
MCHC: 30.8 g/dL (ref 30.0–36.0)
MCV: 89.1 fL (ref 80.0–100.0)
Platelets: 259 10*3/uL (ref 150–400)
RBC: 4.96 MIL/uL (ref 4.22–5.81)
RDW: 13.2 % (ref 11.5–15.5)
WBC: 6.4 10*3/uL (ref 4.0–10.5)
nRBC: 0 % (ref 0.0–0.2)

## 2019-01-26 LAB — BASIC METABOLIC PANEL
Anion gap: 8 (ref 5–15)
BUN: 22 mg/dL (ref 8–23)
CO2: 24 mmol/L (ref 22–32)
Calcium: 9.3 mg/dL (ref 8.9–10.3)
Chloride: 102 mmol/L (ref 98–111)
Creatinine, Ser: 0.79 mg/dL (ref 0.61–1.24)
GFR calc Af Amer: >60 mL/min (ref >60)
GFR calc non Af Amer: >60 mL/min (ref >60)
Glucose, Bld: 98 mg/dL (ref 70–99)
POTASSIUM: 3.9 mmol/L (ref 3.5–5.1)
Sodium: 134 mmol/L — ABNORMAL LOW (ref 135–145)

## 2019-01-26 LAB — TROPONIN I: Troponin I: <0.03 ng/mL (ref <0.03)

## 2019-01-26 MED ORDER — OMEPRAZOLE 20 MG PO CPDR: 20.0000 mg | DELAYED_RELEASE_CAPSULE | Freq: Every day | ORAL | 1 refills | Status: DC

## 2019-01-26 MED ORDER — ALUM & MAG HYDROXIDE-SIMETH 200-200-20 MG/5ML PO SUSP
30.0000 mL | Freq: Once | ORAL | Status: AC
  Administered 2019-01-26: 30 mL via ORAL
  Filled 2019-01-26: qty 30

## 2019-01-26 MED ORDER — SUCRALFATE 1 G PO TABS: 1.0000 g | ORAL_TABLET | Freq: Four times a day (QID) | ORAL | 0 refills | Status: DC | PRN

## 2019-01-26 MED ORDER — LIDOCAINE VISCOUS HCL 2 % MT SOLN
15.0000 mL | Freq: Once | OROMUCOSAL | Status: AC
  Administered 2019-01-26: 15 mL via ORAL
  Filled 2019-01-26: qty 15

## 2019-01-26 NOTE — Discharge Instructions (Addendum)
You were evaluated in the Emergency Department and after careful evaluation, we did not find any emergent condition requiring admission or further testing in the hospital.  Your symptoms today seem to be due to pain related to stomach acid.  Use the Prilosec medication as a long-term medication to be taken once a day.  You can use the Carafate medication up to 4 times daily as needed for discomfort.  Please return to the Emergency Department if you experience any worsening of your condition.  We encourage you to follow up with a primary care provider.  Thank you for allowing [place] to be a part of your care.

## 2019-01-26 NOTE — ED Triage Notes (Signed)
Chest pain x for weeks. States he was seen for same 3 weeks ago and diagnosed with GERD but did not get his Rx filled. States "If I get a Rx this time, I am going straight to the pharmacy."

## 2019-01-26 NOTE — ED Provider Notes (Signed)
[facility] Emergency Department Provider Note MRN:  [id number]  Arrival date & time: 01/26/19     Chief Complaint   Chest Pain   History of Present Illness   Noah [name] is a 68 y.o. year-old male with a history of GERD, hepatitis C presenting to the ED with chief complaint of chest pain.  Patient explains that he was here in late February with the same chest pain, was prescribed an acid suppressing medication.  Forgot to fill it.  Pain has continued since that time, constant, worse with meals.  Described as a burning pain, center of the chest, mild to moderate in severity.  Denies dizziness, no diaphoresis, no nausea, no vomiting, no shortness of breath, no abdominal pain, no dark stool or blood in stool.  Review of Systems  A complete 10 system review of systems was obtained and all systems are negative except as noted in the HPI and PMH.   Patient's Health History    Past Medical History:  Diagnosis Date  . Hepatitis C 1990   [facility]  . History of hepatitis C 04/15/2009     S/P pegylated interferon & ribavirin for 12 months.S/P liver biopsy 2006, Dr Melvia Heaps, GI[place] ? [place] 54; diagnosed age 50 @ [facility]   . Nondependent opioid abuse [facility])     Past Surgical History:  Procedure Laterality Date  . COLONOSCOPY  2003  . PERCUTANEOUS LIVER BIOPSY  2006   Dr Arlyce Dice  . TONSILLECTOMY      Family History  Problem Relation Age of Onset  . Peripheral vascular disease Mother   . Alcohol abuse Father 77    Social History   Socioeconomic History  . Marital status: Single    Spouse name: Not on file  . Number of children: 2  . Years of education: Not on file  . Highest education level: Not on file  Occupational History  . Occupation: retired  Engineer, production  . Financial resource strain: Not on file  . Food insecurity:    Worry: Not on file    Inability: Not on file  . Transportation needs:    Medical: Not on file   Non-medical: Not on file  Tobacco Use  . Smoking status: Never Smoker  . Smokeless tobacco: Never Used  Substance and Sexual Activity  . Alcohol use: Yes    Comment: on weekends  . Drug use: Not Currently    Types: Cocaine    Comment: heroin  . Sexual activity: Not on file  Lifestyle  . Physical activity:    Days per week: Not on file    Minutes per session: Not on file  . Stress: Not on file  Relationships  . Social connections:    Talks on phone: Not on file    Gets together: Not on file    Attends religious service: Not on file    Active member of club or organization: Not on file    Attends meetings of clubs or organizations: Not on file    Relationship status: Not on file  . Intimate partner violence:    Fear of current or ex partner: Not on file    Emotionally abused: Not on file    Physically abused: Not on file    Forced sexual activity: Not on file  Other Topics Concern  . Not on file  Social History Narrative   Lives by himself     Physical Exam  Vital Signs and Nursing Notes reviewed Vitals:   01/26/19 1207  BP: (!) 130/94  Pulse: 74  Resp: 20  Temp: 98.2 F (36.8 C)  SpO2: 99%    CONSTITUTIONAL: Well-appearing, NAD NEURO:  Alert and oriented x 3, no focal deficits EYES:  eyes equal and reactive ENT/NECK:  no LAD, no JVD CARDIO: Regular rate, well-perfused, normal S1 and S2 PULM:  CTAB no wheezing or rhonchi GI/GU:  normal bowel sounds, non-distended, non-tender MSK/SPINE:  No gross deformities, no edema SKIN:  no rash, atraumatic PSYCH:  Appropriate speech and behavior  Diagnostic and Interventional Summary    EKG Interpretation  Date/Time:  Friday January 26 2019 12:01:52 EDT Ventricular Rate:  83 PR Interval:  166 QRS Duration: 90 QT Interval:  380 QTC Calculation: 446 R Axis:   -25 Text Interpretation:  Normal sinus rhythm Normal ECG Confirmed by Kennis Carina [phone]) on 01/26/2019 12:35:59 PM      Labs Reviewed  BASIC METABOLIC  PANEL - Abnormal; Notable for the following components:      Result Value   Sodium 134 (*)    All other components within normal limits  CBC  TROPONIN I    No orders to display    Medications  alum & mag hydroxide-simeth (MAALOX/MYLANTA) 200-200-20 MG/5ML suspension 30 mL (30 mLs Oral Given 01/26/19 1258)    And  lidocaine (XYLOCAINE) 2 % viscous mouth solution 15 mL (15 mLs Oral Given 01/26/19 1258)     Procedures Critical Care  ED Course and Medical Decision Making  I have reviewed the triage vital signs and the nursing notes.  Pertinent labs & imaging results that were available during my care of the patient were reviewed by me and considered in my medical decision making (see below for details).  Suspect continued GI cause of chest pain, very low concern for ACS given the history.  EKG is without concerns, troponin is pending, will advise that he feels his Prilosec.  Clinical Course as of Jan 26 1351  Fri Jan 26, 2019  1259 No shortness of breath, no evidence of DVT on exam, little to no concern for PE today.   [MB]    Clinical Course User Index [MB] Sabas Sous, MD    Troponin is negative, appropriate for discharge with GERD management.  After the discussed management above, the patient was determined to be safe for discharge.  The patient was in agreement with this plan and all questions regarding their care were answered.  ED return precautions were discussed and the patient will return to the ED with any significant worsening of condition.  [name], MD [facility] Emergency Medicine [facility] [email or web address]  Final Clinical Impressions(s) / ED Diagnoses     ICD-10-CM   1. Chest pain, unspecified type R07.9     ED Discharge Orders         Ordered    omeprazole (PRILOSEC) 20 MG capsule  Daily     01/26/19 1351    sucralfate (CARAFATE) 1 g tablet  4 times daily PRN     01/26/19 1351             Sabas Sous, MD  01/26/19 1352

## 2019-10-29 ENCOUNTER — Emergency Department (HOSPITAL_BASED_OUTPATIENT_CLINIC_OR_DEPARTMENT_OTHER): Payer: Medicare HMO

## 2019-10-29 ENCOUNTER — Other Ambulatory Visit: Payer: Self-pay

## 2019-10-29 ENCOUNTER — Encounter (HOSPITAL_BASED_OUTPATIENT_CLINIC_OR_DEPARTMENT_OTHER): Payer: Self-pay

## 2019-10-29 ENCOUNTER — Emergency Department (HOSPITAL_BASED_OUTPATIENT_CLINIC_OR_DEPARTMENT_OTHER)
Admission: EM | Admit: 2019-10-29 | Discharge: 2019-10-29 | Disposition: A | Discharge status: Home / Self Care | Payer: Medicare HMO | Attending: Emergency Medicine | Admitting: Emergency Medicine

## 2019-10-29 DIAGNOSIS — M25512 Pain in left shoulder: Secondary | ICD-10-CM | POA: Diagnosis not present

## 2019-10-29 DIAGNOSIS — Z79899 Other long term (current) drug therapy: Secondary | ICD-10-CM | POA: Insufficient documentation

## 2019-10-29 MED ORDER — HYDROCODONE-ACETAMINOPHEN 5-325 MG PO TABS: 1.0000 | ORAL_TABLET | Freq: Four times a day (QID) | ORAL | 0 refills | Status: DC | PRN

## 2019-10-29 NOTE — ED Provider Notes (Signed)
[place] EMERGENCY DEPARTMENT Provider Note   CSN: [id number] Arrival date & time: 10/29/19  2046     History Chief Complaint  Patient presents with  . Fall    Noah [name] is a 68 y.o. male.  Patient with a fall on December 16 fell on some stairs.  Landed on his left elbow which jammed his left shoulder.  No complaint of any elbow pain.  But has had left shoulder pain difficulty put his hand on top of his head because of this.  Definitely worse with movement.  Does better if he moves it slowly.  No other concerning injuries according to the patient.        Past Medical History:  Diagnosis Date  . Hepatitis C 1990   [facility]  . History of hepatitis C 04/15/2009     S/P pegylated interferon & ribavirin for 12 months.S/P liver biopsy 2006, Dr Erskine Emery, [place] ? [place] 67; diagnosed age 57 @ [facility]   . Nondependent opioid abuse [facility])     Patient Active Problem List   Diagnosis Date Noted  . Lumbosacral spondylosis without myelopathy 04/12/2014  . Lumbar radiculopathy 04/12/2014  . Herniated disc 01/24/2014  . Radicular leg pain 01/24/2014  . Lumbar spondylosis 02/15/2012  . Flexor tendon rupture of hand 07/08/2011  . Osteoarthritis of right knee 05/25/2011  . Gilbert's syndrome 05/17/2011  . Nonspecific abnormal electrocardiogram (ECG) (EKG) 05/17/2011  . History of hepatitis C 04/15/2009    Past Surgical History:  Procedure Laterality Date  . COLONOSCOPY  2003  . PERCUTANEOUS LIVER BIOPSY  2006   Dr Deatra Ina  . TONSILLECTOMY         Family History  Problem Relation Age of Onset  . Peripheral vascular disease Mother   . Alcohol abuse Father 72    Social History   Tobacco Use  . Smoking status: Never Smoker  . Smokeless tobacco: Never Used  Substance Use Topics  . Alcohol use: Not Currently  . Drug use: Not Currently    Types: Cocaine    Comment: heroin    Home Medications Prior to Admission medications     Medication Sig Start Date End Date Taking? Authorizing Provider  fluticasone (FLONASE) 50 MCG/ACT nasal spray SHAKE LIQUID AND USE 2 SPRAYS IN EACH NOSTRIL DAILY 09/19/17   [provider]  HYDROcodone-acetaminophen (NORCO/VICODIN) 5-325 MG tablet Take 1 tablet by mouth every 6 (six) hours as needed for moderate pain. 10/29/19   Fredia Sorrow, MD  meloxicam (MOBIC) 15 MG tablet Take 1 tablet (15 mg total) by mouth daily. 10/04/18   Dene Gentry, MD  Olopatadine HCl 0.2 % SOLN INT 1 GTT OU QD 08/18/18   [provider]  Olopatadine HCl 0.2 % SOLN INT 1 GTT OU QD 10/23/18   [provider]  omeprazole (PRILOSEC) 20 MG capsule Take 1 capsule (20 mg total) by mouth daily. 01/26/19   Maudie Flakes, MD  sucralfate (CARAFATE) 1 g tablet Take 1 tablet (1 g total) by mouth 4 (four) times daily as needed. 01/26/19   Maudie Flakes, MD    Allergies    Bupropion, Citalopram, and Lamotrigine  Review of Systems   Review of Systems  Constitutional: Negative for chills and fever.  HENT: Negative for congestion, rhinorrhea and sore throat.   Eyes: Negative for visual disturbance.  Respiratory: Negative for cough and shortness of breath.   Cardiovascular: Negative for chest pain and leg  swelling.  Gastrointestinal: Negative for abdominal pain, diarrhea, nausea and vomiting.  Genitourinary: Negative for dysuria.  Musculoskeletal: Negative for back pain, joint swelling and neck pain.  Skin: Negative for rash.  Neurological: Negative for dizziness, syncope, light-headedness and headaches.  Hematological: Does not bruise/bleed easily.  Psychiatric/Behavioral: Negative for confusion.    Physical Exam Updated Vital Signs BP (!) 151/93 (BP Location: Left Arm)   Pulse 74   Temp 98.1 F (36.7 C) (Oral)   Resp 18   Ht 1.753 m (5\' 9" )   Wt 83.9 kg   SpO2 96%   BMI 27.32 kg/m   Physical Exam Vitals and nursing note reviewed.  Constitutional:      General: He is not  in acute distress.    Appearance: He is well-developed.  HENT:     Head: Normocephalic and atraumatic.  Eyes:     Extraocular Movements: Extraocular movements intact.     Conjunctiva/sclera: Conjunctivae normal.     Pupils: Pupils are equal, round, and reactive to light.  Neck:     Comments: No posterior neck tenderness. Cardiovascular:     Rate and Rhythm: Normal rate and regular rhythm.     Heart sounds: No murmur.  Pulmonary:     Effort: Pulmonary effort is normal. No respiratory distress.     Breath sounds: Normal breath sounds.  Abdominal:     Palpations: Abdomen is soft.     Tenderness: There is no abdominal tenderness.  Musculoskeletal:        General: Tenderness present.     Cervical back: Normal range of motion and neck supple. No rigidity or tenderness.     Comments: Tenderness to palpation mild of the left shoulder.  No obvious deformity.  But definitely has increased pain with range of motion particularly bringing the shoulder or arm above his head.  Radial pulse distally 2+ neurovascularly intact to the hand.  Good movement of fingers wrist elbow without any pain.  No post anterior neck tenderness.  Skin:    General: Skin is warm and dry.     Capillary Refill: Capillary refill takes less than 2 seconds.  Neurological:     General: No focal deficit present.     Mental Status: He is alert and oriented to person, place, and time.     ED Results / Procedures / Treatments   Labs (all labs ordered are listed, but only abnormal results are displayed) Labs Reviewed - No data to display  EKG None  Radiology DG Shoulder Left  Result Date: 10/29/2019 CLINICAL DATA:  68 year old male with trauma to the left shoulder. EXAM: LEFT SHOULDER - 2+ VIEW COMPARISON:  Chest radiograph dated 01/04/2019. FINDINGS: There is no acute fracture or dislocation. Mild bone spurring from the inferior medial humeral head. The soft tissues are unremarkable. IMPRESSION: No acute fracture or  dislocation. Electronically Signed   By: Elgie CollardArash  Radparvar M.D.   On: 10/29/2019 21:16    Procedures Procedures (including critical care time)  Medications Ordered in ED Medications - No data to display  ED Course  I have reviewed the triage vital signs and the nursing notes.  Pertinent labs & imaging results that were available during my care of the patient were reviewed by me and considered in my medical decision making (see chart for details).    MDM Rules/Calculators/A&P                       Symptoms of very concerning for rotator  cuff injuries of the left shoulder.  X-ray of the left shoulder without any bony abnormalities or any evidence of dislocation.  Will place in a shoulder immobilizer have him follow-up with sports medicine and hydrocodone for pain relief at this time.    Final Clinical Impression(s) / ED Diagnoses Final diagnoses:  Acute pain of left shoulder    Rx / DC Orders ED Discharge Orders         Ordered    HYDROcodone-acetaminophen (NORCO/VICODIN) 5-325 MG tablet  Every 6 hours PRN     10/29/19 2250           Vanetta Mulders, MD 10/29/19 2254

## 2019-10-29 NOTE — ED Triage Notes (Signed)
Pt states he fell 12/16-injured left shoulder-NAD-steady gait

## 2019-10-29 NOTE — Discharge Instructions (Addendum)
Wear the sling on your left arm and shoulder to protect it can take it off to shower.  With sleep with it on.  Take the hydrocodone as needed for pain relief.  Call sports medicine Dr. Rosiland Oz for an appointment and follow-up.  You need additional follow-up for your shoulder injury.  Could represent a rotator cuff injury.  X-rays of the shoulder were negative.

## 2019-10-29 NOTE — ED Notes (Signed)
ED Provider at bedside. 

## 2019-11-15 ENCOUNTER — Ambulatory Visit: Payer: Self-pay

## 2019-11-15 ENCOUNTER — Ambulatory Visit (INDEPENDENT_AMBULATORY_CARE_PROVIDER_SITE_OTHER): Payer: Medicare HMO | Admitting: Family Medicine

## 2019-11-15 ENCOUNTER — Encounter: Payer: Self-pay | Admitting: Family Medicine

## 2019-11-15 ENCOUNTER — Other Ambulatory Visit: Payer: Self-pay

## 2019-11-15 VITALS — BP 122/81 | HR 67 | Ht 70.0 in | Wt 190.0 lb

## 2019-11-15 DIAGNOSIS — M19012 Primary osteoarthritis, left shoulder: Secondary | ICD-10-CM | POA: Insufficient documentation

## 2019-11-15 DIAGNOSIS — S42192A Fracture of other part of scapula, left shoulder, initial encounter for closed fracture: Secondary | ICD-10-CM | POA: Diagnosis not present

## 2019-11-15 DIAGNOSIS — S46012A Strain of muscle(s) and tendon(s) of the rotator cuff of left shoulder, initial encounter: Secondary | ICD-10-CM | POA: Diagnosis not present

## 2019-11-15 MED ORDER — TRIAMCINOLONE ACETONIDE 40 MG/ML IJ SUSP
40.0000 mg | Freq: Once | INTRAMUSCULAR | Status: AC
  Administered 2019-11-15: 12:00:00 40 mg via INTRA_ARTICULAR

## 2019-11-15 NOTE — Assessment & Plan Note (Signed)
Unable to initiate abduction or hold at a level to test empty can.  There appears to be complete retraction of the supraspinatus with ultrasound. -MRI to evaluate for supraspinatus tear

## 2019-11-15 NOTE — Assessment & Plan Note (Signed)
There appears to be a fracture of the scapular spine.  Imaging was unrevealing. -We will try to observe with MRI

## 2019-11-15 NOTE — Patient Instructions (Addendum)
Nice to meet you Please try the sling  Please try ice  Please send me a message in MyChart with any questions or updates.  We will set up a virtual visit once the MRI is resulted  --Dr. Jordan Likes

## 2019-11-15 NOTE — Assessment & Plan Note (Signed)
Observed on x-ray and effusion noted on ultrasound.  Likely from his history of dislocation. -Injection. -Counseled on home exercise therapy and supportive care

## 2019-11-15 NOTE — Progress Notes (Signed)
Noah [name] - 69 y.o. male MRN [id number]  Date of birth: [birth year]  SUBJECTIVE:  Including CC & ROS.  Chief Complaint  Patient presents with  . Shoulder Injury    left shoulder    Noah [name] is a 69 y.o. male that is presenting with acute left shoulder pain.  He had a fall on 12/21 and he was trying to catch himself.  Since that time he has had significant left shoulder pain.  He was seen in the emergency department on 12/21 and provided a sling.  The pain is still ongoing and significant at night.  He is unable to abduct his shoulder at all.  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  He has taken pain medication initially.  Pain is throbbing and can be severe.   Independent review left shoulder x-ray from 12/21 shows no acute abnormality.  Some mild degenerative changes.   Review of Systems See HPI   HISTORY: Past Medical, Surgical, Social, and Family History Reviewed & Updated per EMR.   Pertinent Historical Findings include:  Past Medical History:  Diagnosis Date  . Hepatitis C 1990   [facility]  . History of hepatitis C 04/15/2009     S/P pegylated interferon & ribavirin for 12 months.S/P liver biopsy 2006, Dr Erskine Emery, GI[place] ? [place] 58; diagnosed age 62 [facility]   . Nondependent opioid abuse [facility])     Past Surgical History:  Procedure Laterality Date  . COLONOSCOPY  2003  . PERCUTANEOUS LIVER BIOPSY  2006   Dr Deatra Ina  . TONSILLECTOMY      Allergies  Allergen Reactions  . Bupropion Rash  . Citalopram Rash  . Lamotrigine Rash    Family History  Problem Relation Age of Onset  . Peripheral vascular disease Mother   . Alcohol abuse Father 32     Social History   Socioeconomic History  . Marital status: Divorced    Spouse name: Not on file  . Number of children: 2  . Years of education: Not on file  . Highest education level: Not on file  Occupational History  . Occupation: retired    Tobacco Use  . Smoking status: Never Smoker  . Smokeless tobacco: Never Used  Substance and Sexual Activity  . Alcohol use: Not Currently  . Drug use: Not Currently    Types: Cocaine    Comment: heroin  . Sexual activity: Not on file  Other Topics Concern  . Not on file  Social History Narrative   Lives by himself   Social Determinants of Health   Financial Resource Strain:   . Difficulty of Paying Living Expenses: Not on file  Food Insecurity:   . Worried About Charity fundraiser in the Last Year: Not on file  . Ran Out of Food in the Last Year: Not on file  Transportation Needs:   . Lack of Transportation (Medical): Not on file  . Lack of Transportation (Non-Medical): Not on file  Physical Activity:   . Days of Exercise per Week: Not on file  . Minutes of Exercise per Session: Not on file  Stress:   . Feeling of Stress : Not on file  Social Connections:   . Frequency of Communication with Friends and Family: Not on file  . Frequency of Social Gatherings with Friends and Family: Not on file  . Attends Religious Services: Not on file  .  Active Member of Clubs or Organizations: Not on file  . Attends Banker Meetings: Not on file  . Marital Status: Not on file  Intimate Partner Violence:   . Fear of Current or Ex-Partner: Not on file  . Emotionally Abused: Not on file  . Physically Abused: Not on file  . Sexually Abused: Not on file     PHYSICAL EXAM:  VS: BP 122/81   Pulse 67   Ht 5\' 10"  (1.778 m)   Wt 190 lb (86.2 kg)   BMI 27.26 kg/m  Physical Exam Gen: NAD, alert, cooperative with exam, well-appearing ENT: normal lips, normal nasal mucosa,  Eye: normal EOM, normal conjunctiva and lids Skin: no rashes, no areas of induration  Neuro: normal tone, normal sensation to touch Psych:  normal insight, alert and oriented MSK:  Left shoulder: Obvious swelling over the posterior aspect of the joint. Unable to initiate abduction. Normal external  rotation passively. Pain with external rotation and abduction. Unable to hold her arm to test for empty can. Neurovascularly intact  Limited ultrasound: Left shoulder:  There appears to be disruption of the proximal biceps tendon. Subscapularis is intact. There appears to be complete disruption and retraction of the supraspinatus with large effusion noted in this area. There is no effusion noted in the posterior glenohumeral joint.  There appears to be a hematoma just inferior to the scapular spine. There appears to be a nondisplaced fracture of the mid belly of the scapular spine with increased hyperemia in this area  Summary: Findings suggest complete supraspinatus tear and retraction.  Possible scapular spine fracture.  Large hematoma just inferior to the scapular spine  Ultrasound and interpretation by , MD   Aspiration/Injection Procedure Note Noah [name] Nov 03, 1951  Procedure: Injection Indications: Left shoulder pain  Procedure Details Consent: Risks of procedure as well as the alternatives and risks of each were explained to the (patient/caregiver).  Consent for procedure obtained. Time Out: Verified patient identification, verified procedure, site/side was marked, verified correct patient position, special equipment/implants available, medications/allergies/relevent history reviewed, required imaging and test results available.  Performed.  The area was cleaned with iodine and alcohol swabs.    The left glenohumeral joint was injected using 1 cc's of 40 mg Kenalog and 4 cc's of 0.25% bupivacaine with a 22 3 1/2" needle.  Ultrasound was used. Images were obtained in short views showing the injection.     A sterile dressing was applied.  Patient did tolerate procedure well.    ASSESSMENT & PLAN:   Primary osteoarthritis of left shoulder Observed on x-ray and effusion noted on ultrasound.  Likely from his history of  dislocation. -Injection. -Counseled on home exercise therapy and supportive care  Traumatic complete tear of left rotator cuff Unable to initiate abduction or hold at a level to test empty can.  There appears to be complete retraction of the supraspinatus with ultrasound. -MRI to evaluate for supraspinatus tear  Closed fracture of spine of left scapula There appears to be a fracture of the scapular spine.  Imaging was unrevealing. -We will try to observe with MRI

## 2019-12-13 ENCOUNTER — Other Ambulatory Visit: Payer: Self-pay

## 2019-12-13 ENCOUNTER — Ambulatory Visit
Admission: RE | Admit: 2019-12-13 | Discharge: 2019-12-13 | Disposition: A | Discharge status: Home / Self Care | Payer: Medicare HMO | Source: Ambulatory Visit | Attending: Family Medicine | Admitting: Family Medicine

## 2019-12-13 DIAGNOSIS — S46012A Strain of muscle(s) and tendon(s) of the rotator cuff of left shoulder, initial encounter: Secondary | ICD-10-CM

## 2019-12-13 DIAGNOSIS — S42192A Fracture of other part of scapula, left shoulder, initial encounter for closed fracture: Secondary | ICD-10-CM

## 2019-12-18 ENCOUNTER — Telehealth: Payer: Medicare HMO | Admitting: Family Medicine

## 2019-12-26 ENCOUNTER — Telehealth (INDEPENDENT_AMBULATORY_CARE_PROVIDER_SITE_OTHER): Payer: Medicare HMO | Admitting: Family Medicine

## 2019-12-26 DIAGNOSIS — S42192D Fracture of other part of scapula, left shoulder, subsequent encounter for fracture with routine healing: Secondary | ICD-10-CM | POA: Diagnosis not present

## 2019-12-26 DIAGNOSIS — X58XXXD Exposure to other specified factors, subsequent encounter: Secondary | ICD-10-CM | POA: Diagnosis not present

## 2019-12-26 DIAGNOSIS — S46012D Strain of muscle(s) and tendon(s) of the rotator cuff of left shoulder, subsequent encounter: Secondary | ICD-10-CM

## 2019-12-26 NOTE — Assessment & Plan Note (Signed)
MRI was confirming for the scapular spine fracture. -Counseled on home exercise therapy and supportive care.

## 2019-12-26 NOTE — Assessment & Plan Note (Signed)
MRI was confirming rotator cuff tear. -Referral to orthopedic surgery.

## 2019-12-26 NOTE — Progress Notes (Signed)
Virtual Visit via Video Note  I connected with Rainey S Rudman on 12/26/19 at  8:10 AM EST by a video enabled telemedicine application and verified that I am speaking with the correct person using two identifiers.   I discussed the limitations of evaluation and management by telemedicine and the availability of in person appointments. The patient expressed understanding and agreed to proceed.  History of Present Illness:  Mr. Canino is a 69 year old male that is following up for his left shoulder and scapular pain.  MRI was confirming a  large full-thickness scapular spine fracture.  He has had some improvement of his motion.  Pain is still occurring.  Observations/Objective:  Gen: NAD, alert, cooperative with exam, well-appearing  Assessment and Plan:  Complete tear of the left rotator cuff: MRI was confirming rotator cuff tear. -Referral to orthopedic surgery.  Closed fracture of the spine of the scapula: MRI was confirming for the scapular spine fracture. -Counseled on home exercise therapy and supportive care.  Follow Up Instructions:    I discussed the assessment and treatment plan with the patient. The patient was provided an opportunity to ask questions and all were answered. The patient agreed with the plan and demonstrated an understanding of the instructions.   The patient was advised to call back or seek an in-person evaluation if the symptoms worsen or if the condition fails to improve as anticipated.    Clare Gandy, MD

## 2020-01-14 ENCOUNTER — Telehealth: Payer: Self-pay | Admitting: Family Medicine

## 2020-01-14 NOTE — Telephone Encounter (Signed)
error 

## 2020-01-16 ENCOUNTER — Encounter (HOSPITAL_BASED_OUTPATIENT_CLINIC_OR_DEPARTMENT_OTHER): Payer: Self-pay | Admitting: Emergency Medicine

## 2020-01-16 ENCOUNTER — Emergency Department (HOSPITAL_BASED_OUTPATIENT_CLINIC_OR_DEPARTMENT_OTHER): Payer: Medicare HMO

## 2020-01-16 ENCOUNTER — Other Ambulatory Visit: Payer: Self-pay

## 2020-01-16 ENCOUNTER — Emergency Department (HOSPITAL_BASED_OUTPATIENT_CLINIC_OR_DEPARTMENT_OTHER)
Admission: EM | Admit: 2020-01-16 | Discharge: 2020-01-16 | Disposition: A | Discharge status: Home / Self Care | Payer: Medicare HMO | Attending: Emergency Medicine | Admitting: Emergency Medicine

## 2020-01-16 DIAGNOSIS — Z79899 Other long term (current) drug therapy: Secondary | ICD-10-CM | POA: Insufficient documentation

## 2020-01-16 DIAGNOSIS — Y999 Unspecified external cause status: Secondary | ICD-10-CM | POA: Insufficient documentation

## 2020-01-16 DIAGNOSIS — Y92810 Car as the place of occurrence of the external cause: Secondary | ICD-10-CM | POA: Insufficient documentation

## 2020-01-16 DIAGNOSIS — S6992XA Unspecified injury of left wrist, hand and finger(s), initial encounter: Secondary | ICD-10-CM | POA: Diagnosis present

## 2020-01-16 DIAGNOSIS — Z7982 Long term (current) use of aspirin: Secondary | ICD-10-CM | POA: Diagnosis not present

## 2020-01-16 DIAGNOSIS — S62639B Displaced fracture of distal phalanx of unspecified finger, initial encounter for open fracture: Secondary | ICD-10-CM

## 2020-01-16 DIAGNOSIS — Y9389 Activity, other specified: Secondary | ICD-10-CM | POA: Diagnosis not present

## 2020-01-16 DIAGNOSIS — Z888 Allergy status to other drugs, medicaments and biological substances status: Secondary | ICD-10-CM | POA: Insufficient documentation

## 2020-01-16 DIAGNOSIS — W231XXA Caught, crushed, jammed, or pinched between stationary objects, initial encounter: Secondary | ICD-10-CM | POA: Insufficient documentation

## 2020-01-16 DIAGNOSIS — S62635B Displaced fracture of distal phalanx of left ring finger, initial encounter for open fracture: Secondary | ICD-10-CM | POA: Insufficient documentation

## 2020-01-16 MED ORDER — LIDOCAINE HCL (PF) 1 % IJ SOLN
5.0000 mL | Freq: Once | INTRAMUSCULAR | Status: AC
  Administered 2020-01-16: 5 mL
  Filled 2020-01-16: qty 5

## 2020-01-16 MED ORDER — CEPHALEXIN 500 MG PO CAPS: 500.0000 mg | ORAL_CAPSULE | Freq: Four times a day (QID) | ORAL | 0 refills | Status: AC

## 2020-01-16 MED ORDER — IBUPROFEN 400 MG PO TABS
600.0000 mg | ORAL_TABLET | Freq: Once | ORAL | Status: AC
  Administered 2020-01-16: 600 mg via ORAL
  Filled 2020-01-16: qty 1

## 2020-01-16 NOTE — Discharge Instructions (Addendum)
Call Dr. Glenna Durand office to schedule an appointment for Friday 3/12  Pick up antibiotics and take as prescribed to prevent an infection Wear splints until you are seen by Dr. Melvyn Novas  Keep wound clean and dry. You can change the dressing as needed if you have some minimal bleeding Take Ibuprofen 600-800 mg every 6-8 hours and Tylenol 1,000 mg every 8 hours as needed for pain Return to the ED for any signs of infection including redness around the wound, swelling around the wound, drainage of pus, fevers > 100.4, or chills

## 2020-01-16 NOTE — ED Notes (Signed)
ED Provider at bedside. 
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
## 2020-05-24 ENCOUNTER — Emergency Department (HOSPITAL_BASED_OUTPATIENT_CLINIC_OR_DEPARTMENT_OTHER)
Admission: EM | Admit: 2020-05-24 | Discharge: 2020-05-24 | Disposition: A | Discharge status: Home / Self Care | Payer: Medicare HMO | Attending: Emergency Medicine | Admitting: Emergency Medicine

## 2020-05-24 ENCOUNTER — Encounter (HOSPITAL_BASED_OUTPATIENT_CLINIC_OR_DEPARTMENT_OTHER): Payer: Self-pay

## 2020-05-24 ENCOUNTER — Other Ambulatory Visit: Payer: Self-pay

## 2020-05-24 DIAGNOSIS — W57XXXA Bitten or stung by nonvenomous insect and other nonvenomous arthropods, initial encounter: Secondary | ICD-10-CM | POA: Insufficient documentation

## 2020-05-24 DIAGNOSIS — Y999 Unspecified external cause status: Secondary | ICD-10-CM | POA: Insufficient documentation

## 2020-05-24 DIAGNOSIS — Y939 Activity, unspecified: Secondary | ICD-10-CM | POA: Insufficient documentation

## 2020-05-24 DIAGNOSIS — S1096XA Insect bite of unspecified part of neck, initial encounter: Secondary | ICD-10-CM | POA: Insufficient documentation

## 2020-05-24 DIAGNOSIS — Z7982 Long term (current) use of aspirin: Secondary | ICD-10-CM | POA: Insufficient documentation

## 2020-05-24 DIAGNOSIS — Y929 Unspecified place or not applicable: Secondary | ICD-10-CM | POA: Insufficient documentation

## 2020-05-24 DIAGNOSIS — Z79899 Other long term (current) drug therapy: Secondary | ICD-10-CM | POA: Insufficient documentation

## 2020-05-24 MED ORDER — MUPIROCIN CALCIUM 2 % EX CREA
TOPICAL_CREAM | Freq: Three times a day (TID) | CUTANEOUS | Status: DC
  Filled 2020-05-24: qty 15

## 2020-05-24 MED ORDER — MUPIROCIN 2 % EX OINT
TOPICAL_OINTMENT | CUTANEOUS | Status: AC
  Administered 2020-05-24: 1
  Filled 2020-05-24: qty 22

## 2020-05-24 NOTE — ED Triage Notes (Signed)
Pt reports he was bite by an insect on the neck on Thursday. A few days later pt pulled something black out of it. Pt has been treating it with alcohol applications and toothpaste.

## 2020-05-24 NOTE — ED Provider Notes (Signed)
[facility] Provider Note: Lowella Dell, MD, FACEP  CSN: [id number] MRN: [id number] ARRIVAL: 05/24/20 at 2056 ROOM: MH04/[facility]04   CHIEF COMPLAINT  Insect Bite   HISTORY OF PRESENT ILLNESS  05/24/20 11:00 PM Noah [name] is a 69 y.o. male who was bitten or stung on the back of the neck 2 days ago.  He has been treating it with topical alcohol and toothpaste.  He subsequently pulled something black out of it but did not keep it.  He does not know if this is a scab, a stinger or a piece of an insect.  He states it was significantly painful at the time of the bite/sting but it is not painful now.  He is not having any systemic symptoms.   Past Medical History:  Diagnosis Date  . Hepatitis C 1990   [facility]  . History of hepatitis C 04/15/2009     S/P pegylated interferon & ribavirin for 12 months.S/P liver biopsy 2006, Dr Melvia Heaps, GI[place] ? [place] 88; diagnosed age 82 [facility]   . Nondependent opioid abuse [facility])     Past Surgical History:  Procedure Laterality Date  . COLONOSCOPY  2003  . PERCUTANEOUS LIVER BIOPSY  2006   Dr Arlyce Dice  . TONSILLECTOMY      Family History  Problem Relation Age of Onset  . Peripheral vascular disease Mother   . Alcohol abuse Father 54    Social History   Tobacco Use  . Smoking status: Never Smoker  . Smokeless tobacco: Never Used  Vaping Use  . Vaping Use: Never used  Substance Use Topics  . Alcohol use: Yes  . Drug use: Not Currently    Types: Cocaine    Comment: heroin    Prior to Admission medications   Medication Sig Start Date End Date Taking? Authorizing Provider  aspirin 81 MG chewable tablet Chew by mouth. 08/28/19   [provider]  atorvastatin (LIPITOR) 20 MG tablet Take by mouth. 08/28/19   [provider]  cephALEXin (KEFLEX) 500 MG capsule Take 1 capsule (500 mg total) by mouth 4 (four) times daily. 01/16/20   Tanda Rockers, PA-C  cyanocobalamin 1000 MCG tablet  Take by mouth. 08/28/19   [provider]  ipratropium (ATROVENT) 0.06 % nasal spray Place into the nose. 12/20/19 01/19/20  [provider]  Olopatadine HCl 0.2 % SOLN INT 1 GTT OU QD 10/23/18   [provider]  pantoprazole (PROTONIX) 40 MG tablet Take by mouth. 08/28/19   [provider]  polyethylene glycol powder (GLYCOLAX/MIRALAX) 17 GM/SCOOP powder Take 1-2 capfull daily 08/27/19   [provider]  sodium chloride (OCEAN) 0.65 % nasal spray Place into the nose. 12/20/19 01/19/20  [provider]    Allergies Bupropion, Citalopram, and Lamotrigine   REVIEW OF SYSTEMS  Negative except as noted here or in the History of Present Illness.   PHYSICAL EXAMINATION  Initial Vital Signs Blood pressure (!) 142/89, pulse 78, temperature 98.7 F (37.1 C), temperature source Oral, resp. rate 20, height 5\' 9"  (1.753 m), weight 81.6 kg, SpO2 96 %.  Examination General: Well-developed, well-nourished male in no acute distress; appearance consistent with age of record HENT: normocephalic; atraumatic Eyes: Normal appearance Neck: supple  Heart: regular rate and rhythm Lungs: clear to auscultation bilaterally Abdomen: soft; nondistended; nontender; bowel sounds present Extremities: No deformity; full range of motion Neurologic: Awake, alert and oriented; motor function intact in all extremities and  symmetric; no facial droop Skin: Warm and dry; small superficial skin ulceration on back of neck without signs of acute infection:    Psychiatric: Normal mood and affect   RESULTS  Summary of this visit's results, reviewed and interpreted by myself:   EKG Interpretation  Date/Time:    Ventricular Rate:    PR Interval:    QRS Duration:   QT Interval:    QTC Calculation:   R Axis:     Text Interpretation:        Laboratory Studies: No results found for this or any previous visit (from the past 24 hour(s)). Imaging Studies: No  results found.  ED COURSE and MDM  Nursing notes, initial and subsequent vitals signs, including pulse oximetry, reviewed and interpreted by myself.  Vitals:   05/24/20 2125 05/24/20 2126  BP: (!) 142/89   Pulse: 78   Resp: 20   Temp: 98.7 F (37.1 C)   TempSrc: Oral   SpO2: 96%   Weight:  81.6 kg  Height:  5\' 9"  (1.753 m)   Medications  mupirocin cream (BACTROBAN) 2 % (has no administration in time range)    The patient skin lesion does not appear infected but we will treat with topical Bactroban in anticipation of eventual healing.    PROCEDURES  Procedures   ED DIAGNOSES     ICD-10-CM   1. Insect bites and stings, initial encounter  W57. , MD 05/24/20 [phone]

## 2020-09-13 ENCOUNTER — Encounter (HOSPITAL_BASED_OUTPATIENT_CLINIC_OR_DEPARTMENT_OTHER): Payer: Self-pay | Admitting: Emergency Medicine

## 2020-09-13 ENCOUNTER — Emergency Department (HOSPITAL_BASED_OUTPATIENT_CLINIC_OR_DEPARTMENT_OTHER)
Admission: EM | Admit: 2020-09-13 | Discharge: 2020-09-13 | Disposition: A | Discharge status: Home / Self Care | Payer: Medicare HMO | Attending: Emergency Medicine | Admitting: Emergency Medicine

## 2020-09-13 ENCOUNTER — Other Ambulatory Visit: Payer: Self-pay

## 2020-09-13 ENCOUNTER — Emergency Department (HOSPITAL_BASED_OUTPATIENT_CLINIC_OR_DEPARTMENT_OTHER): Payer: Medicare HMO

## 2020-09-13 DIAGNOSIS — M25571 Pain in right ankle and joints of right foot: Secondary | ICD-10-CM | POA: Diagnosis present

## 2020-09-13 DIAGNOSIS — Z7982 Long term (current) use of aspirin: Secondary | ICD-10-CM | POA: Insufficient documentation

## 2020-09-13 DIAGNOSIS — M79671 Pain in right foot: Secondary | ICD-10-CM | POA: Insufficient documentation

## 2020-09-13 NOTE — ED Triage Notes (Signed)
Reports right ankle pain for the last week.  Denies any injury.  Reports he is having difficulty walking on it.

## 2020-09-13 NOTE — Discharge Instructions (Signed)
As we discussed, your x-ray today was reassuring.  I suspect her symptoms may be related to Achilles tendinitis which can be inflammation of the Achilles tendon.  You can use the brace and crutches.  Follow-up with referred orthopedic doctor if you continue to pain.  You can take Tylenol or Ibuprofen as directed for pain. You can alternate Tylenol and Ibuprofen every 4 hours. If you take Tylenol at 1pm, then you can take Ibuprofen at 5pm. Then you can take Tylenol again at 9pm.   Follow the RICE (Rest, Ice, Compression, Elevation) protocol as directed.   He is return to the emergency department any worsening pain, redness or swelling, numbness/weakness, fevers or any other worsening concerning symptoms.

## 2020-09-13 NOTE — ED Notes (Signed)
Pt discharged to home. Discharge instructions have been discussed with patient and/or family members. Pt verbally acknowledges understanding d/c instructions, and endorses comprehension to checkout at registration before leaving.  °

## 2020-09-13 NOTE — ED Provider Notes (Signed)
[facility] EMERGENCY DEPARTMENT Provider Note   CSN: [id number] Arrival date & time: 09/13/20  1456     History Chief Complaint  Patient presents with  . Ankle Pain    Noah [name] is a 69 y.o. male who presents for evaluation of right ankle pain that has been ongoing for about a week.  He states that last week or so, he has had pain in the posterior aspect of his foot.  He states that if he has it up, does not hurt but as soon as he tries ambulate and bear weight, it hurts in the posterior aspect.  No trauma, injury, fall.  He has not noted any overlying warmth, erythema, edema.  He denies any numbness/weakness.  The history is provided by the patient.       Past Medical History:  Diagnosis Date  . Hepatitis C 1990   [facility]  . History of hepatitis C 04/15/2009     S/P pegylated interferon & ribavirin for 12 months.S/P liver biopsy 2006, Dr Melvia Heaps, GI[place] ? [place] 77; diagnosed age 42 [facility]   . Nondependent opioid abuse [facility])     Patient Active Problem List   Diagnosis Date Noted  . Primary osteoarthritis of left shoulder 11/15/2019  . Traumatic complete tear of left rotator cuff 11/15/2019  . Closed fracture of spine of left scapula 11/15/2019  . Lumbosacral spondylosis without myelopathy 04/12/2014  . Lumbar radiculopathy 04/12/2014  . Herniated disc 01/24/2014  . Radicular leg pain 01/24/2014  . Lumbar spondylosis 02/15/2012  . Flexor tendon rupture of hand 07/08/2011  . Osteoarthritis of right knee 05/25/2011  . Gilbert's syndrome 05/17/2011  . Nonspecific abnormal electrocardiogram (ECG) (EKG) 05/17/2011  . History of hepatitis C 04/15/2009    Past Surgical History:  Procedure Laterality Date  . COLONOSCOPY  2003  . PERCUTANEOUS LIVER BIOPSY  2006   Dr Arlyce Dice  . TONSILLECTOMY         Family History  Problem Relation Age of Onset  . Peripheral vascular disease Mother   . Alcohol abuse Father 48    Social  History   Tobacco Use  . Smoking status: Never Smoker  . Smokeless tobacco: Never Used  Vaping Use  . Vaping Use: Never used  Substance Use Topics  . Alcohol use: Yes  . Drug use: Not Currently    Types: Cocaine    Comment: heroin    Home Medications Prior to Admission medications   Medication Sig Start Date End Date Taking? Authorizing Provider  aspirin 81 MG chewable tablet Chew by mouth. 08/28/19   [provider]  atorvastatin (LIPITOR) 20 MG tablet Take by mouth. 08/28/19   [provider]  cephALEXin (KEFLEX) 500 MG capsule Take 1 capsule (500 mg total) by mouth 4 (four) times daily. 01/16/20   Tanda Rockers, PA-C  cyanocobalamin 1000 MCG tablet Take by mouth. 08/28/19   [provider]  ipratropium (ATROVENT) 0.06 % nasal spray Place into the nose. 12/20/19 01/19/20  [provider]  Olopatadine HCl 0.2 % SOLN INT 1 GTT OU QD 10/23/18   [provider]  pantoprazole (PROTONIX) 40 MG tablet Take by mouth. 08/28/19   [provider]  polyethylene glycol powder (GLYCOLAX/MIRALAX) 17 GM/SCOOP powder Take 1-2 capfull daily 08/27/19   [provider]  sodium chloride (OCEAN) 0.65 % nasal spray Place into the nose. 12/20/19 01/19/20  [provider]    Allergies  Bupropion, Citalopram, and Lamotrigine  Review of Systems   Review of Systems  Constitutional: Negative for fever.  Musculoskeletal:       Ankle pain  Neurological: Negative for weakness and numbness.  All other systems reviewed and are negative.   Physical Exam Updated Vital Signs BP 127/84 (BP Location: Left Arm)   Pulse 62   Temp 97.8 F (36.6 C) (Oral)   Resp 18   Ht 5\' 9"  (1.753 m)   Wt 81.6 kg   SpO2 100%   BMI 26.58 kg/m   Physical Exam Vitals and nursing note reviewed.  Constitutional:      Appearance: Normal appearance. He is well-developed.  HENT:     Head: Normocephalic and atraumatic.  Eyes:     General: Lids are  normal.     Conjunctiva/sclera: Conjunctivae normal.     Pupils: Pupils are equal, round, and reactive to light.  Cardiovascular:     Heart sounds: No gallop.   Abdominal:     Palpations: Abdomen is not rigid.  Musculoskeletal:        General: Normal range of motion.     Cervical back: Full passive range of motion without pain.     Comments: Tenderness palpation the posterior aspect of the Achilles tendon.  No overlying warmth, erythema.  Dorsiflexion and plantarflexion intact.  No bony tenderness in about malleolus, dorsal aspect of foot.  Wiggle all 5 toes with any difficulty.  Skin:    General: Skin is warm and dry.     Capillary Refill: Capillary refill takes less than 2 seconds.     Comments: Good distal cap refill. RLE is not dusky in appearance or cool to touch.  Neurological:     Mental Status: He is alert and oriented to person, place, and time.     Comments: Sensation intact along major nerve distributions of BLE  Psychiatric:        Speech: Speech normal.     ED Results / Procedures / Treatments   Labs (all labs ordered are listed, but only abnormal results are displayed) Labs Reviewed - No data to display  EKG None  Radiology DG Ankle Complete Right  Result Date: 09/13/2020 CLINICAL DATA:  Right ankle pain. Pain for 1 week, but intermittent since July. No known injury EXAM: RIGHT ANKLE - COMPLETE 3+ VIEW COMPARISON:  None. FINDINGS: There is no evidence of fracture, dislocation, or joint effusion. Ankle mortise is preserved. Suggestion of pes planus of the included foot on these nonweightbearing views. Minimal talonavicular degenerative change. Small plantar calcaneal spur. There are vascular calcifications. Soft tissues are unremarkable. IMPRESSION: 1. No acute abnormality. 2. Suggestion of pes planus of the included foot on these nonweightbearing views. 3. Small plantar calcaneal spur. Electronically Signed   By: August M.D.   On: 09/13/2020 16:30     Procedures Procedures (including critical care time)  Medications Ordered in ED Medications - No data to display  ED Course  I have reviewed the triage vital signs and the nursing notes.  Pertinent labs & imaging results that were available during my care of the patient were reviewed by me and considered in my medical decision making (see chart for details).    MDM Rules/Calculators/A&P                          69 year old male who presents for evaluation of right ankle pain x1 week.  No injury.  He states that  when he rests it, does not hurt but when he puts pressure on it, he has pain noted to the Achilles tendon.  No warmth, erythema.  No numbness/weakness.  Initial arrival, he is afebrile, nontoxic-appearing.  Vital signs are stable.  He is neurovascular intact.  On exam, he does have some tenderness noted to the Achilles tendon but no palpable deficits.  Plantar flexion and dorsiflexion intact.  No overlying warmth, erythema.  Consider tendinitis versus musculoskeletal pain.  Low suspicion for fracture dislocation given lack of trauma, fall.  History/physical exam not concerning for septic arthritis, ischemic limb.  We will plan for x-rays for evaluation.  X-rays reviewed.  Negative for any acute abnormality.  Discussed results with patient.  Discussed with patient that he can follow-up with referred orthopedic doctor for further evaluation.  We will plan to give him ASO splint and crutches. At this time, patient exhibits no emergent life-threatening condition that require further evaluation in ED. Patient had ample opportunity for questions and discussion. All patient's questions were answered with full understanding. Strict return precautions discussed. Patient expresses understanding and agreement to plan.   Portions of this note were generated with Scientist, clinical (histocompatibility and immunogenetics). Dictation errors may occur despite best attempts at proofreading.   Final Clinical Impression(s) / ED  Diagnoses Final diagnoses:  Right foot pain    Rx / DC Orders ED Discharge Orders    None       Maxwell Caul, PA-C 09/13/20 1943    Rolan Bucco, MD 09/13/20 1954

## 2020-09-13 NOTE — ED Notes (Signed)
ED Provider at bedside. 

## 2020-09-26 ENCOUNTER — Telehealth: Payer: Self-pay | Admitting: Family Medicine

## 2020-09-26 NOTE — Telephone Encounter (Signed)
Called pt to offer ED follow up --per pt he has to see [facility] providers not [facility]--Appt declined.  --glh

## 2020-11-15 ENCOUNTER — Other Ambulatory Visit: Payer: Self-pay

## 2020-11-15 ENCOUNTER — Emergency Department (HOSPITAL_BASED_OUTPATIENT_CLINIC_OR_DEPARTMENT_OTHER)
Admission: EM | Admit: 2020-11-15 | Discharge: 2020-11-15 | Disposition: A | Discharge status: Home / Self Care | Payer: Medicare HMO | Attending: Emergency Medicine | Admitting: Emergency Medicine

## 2020-11-15 ENCOUNTER — Encounter (HOSPITAL_BASED_OUTPATIENT_CLINIC_OR_DEPARTMENT_OTHER): Payer: Self-pay

## 2020-11-15 DIAGNOSIS — M25462 Effusion, left knee: Secondary | ICD-10-CM | POA: Insufficient documentation

## 2020-11-15 DIAGNOSIS — M25461 Effusion, right knee: Secondary | ICD-10-CM | POA: Diagnosis not present

## 2020-11-15 DIAGNOSIS — I2510 Atherosclerotic heart disease of native coronary artery without angina pectoris: Secondary | ICD-10-CM | POA: Insufficient documentation

## 2020-11-15 DIAGNOSIS — M25561 Pain in right knee: Secondary | ICD-10-CM | POA: Diagnosis present

## 2020-11-15 DIAGNOSIS — M25475 Effusion, left foot: Secondary | ICD-10-CM

## 2020-11-15 DIAGNOSIS — M25471 Effusion, right ankle: Secondary | ICD-10-CM

## 2020-11-15 DIAGNOSIS — Z7982 Long term (current) use of aspirin: Secondary | ICD-10-CM | POA: Diagnosis not present

## 2020-11-15 MED ORDER — KETOROLAC TROMETHAMINE 60 MG/2ML IM SOLN
30.0000 mg | Freq: Once | INTRAMUSCULAR | Status: AC
  Administered 2020-11-15: 30 mg via INTRAMUSCULAR
  Filled 2020-11-15: qty 2

## 2020-11-15 MED ORDER — MELOXICAM 15 MG PO TABS: 15.0000 mg | ORAL_TABLET | Freq: Every day | ORAL | 0 refills | Status: AC

## 2020-11-15 NOTE — Discharge Instructions (Signed)
Please read and follow all provided instructions.  Your diagnoses today include:  1. Bilateral knee effusions   2. Bilateral swelling of feet and ankles     Tests performed today include:  Vital signs. See below for your results today.   Medications prescribed:   Meloxicam - anti-inflammatory pain medication  You have been prescribed an anti-inflammatory medication or NSAID. Take with food. Do not take aspirin, ibuprofen, or naproxen if taking this medication. Take smallest effective dose for the shortest duration needed for your pain. Stop taking if you experience stomach pain or vomiting.   Take any prescribed medications only as directed.  Home care instructions:   Follow any educational materials contained in this packet  Follow R.I.C.E. Protocol:  R - rest your injury   I  - use ice on injury without applying directly to skin  C - compress injury with bandage or splint  E - elevate the injury as much as possible  Follow-up instructions: Please follow-up with your orthopedic physician (bone specialist) in 1 week for further evaluation.  Return instructions:   Please return if your toes or feet are numb or tingling, appear gray or blue, or you have severe pain (also elevate the leg and loosen splint or wrap if you were given one)  Return to the emergency department if you develop redness or warmth in your joints or of the leg itself, or if you develop fever.  Please return to the Emergency Department if you experience worsening symptoms.   Please return if you have any other emergent concerns.  Additional Information:  Your vital signs today were: BP 121/68 (BP Location: Right Arm)    Pulse 89    Temp 97.8 F (36.6 C) (Oral)    Resp 16    Ht 5\' 9"  (1.753 m)    Wt 86.2 kg    SpO2 100%    BMI 28.06 kg/m  If your blood pressure (BP) was elevated above 135/85 this visit, please have this repeated by your doctor within one month. --------------

## 2020-11-15 NOTE — ED Provider Notes (Signed)
[facility] EMERGENCY DEPARTMENT Provider Note   CSN: [id number] Arrival date & time: 11/15/20  1442     History Chief Complaint  Patient presents with  . Leg Pain  . Leg Swelling    Noah [name] is a 70 y.o. male.  Patient with history of arthritis presents the emergency department for evaluation of bilateral knee and ankle swelling and pain ongoing over the past 2 weeks.  He denies previous injury.  He has not been taking any medication for his symptoms.  He states that he is working to get an appointment with [facility] for evaluation.  Patient was seen in 2012 for a right knee effusion and had an arthrocentesis which did not show crystals or signs of infection.  Of note, patient had recent heart catheterization which showed mild nonobstructive coronary artery disease.  He denies fevers, chills, nausea or vomiting.  He is using crutches to assist him to ambulate.        Past Medical History:  Diagnosis Date  . Hepatitis C 1990   [facility]  . History of hepatitis C 04/15/2009     S/P pegylated interferon & ribavirin for 12 months.S/P liver biopsy 2006, Dr Melvia Heaps, GI[place] ? [place] 70; diagnosed age 4 @ [facility]   . Nondependent opioid abuse [facility])     Patient Active Problem List   Diagnosis Date Noted  . Primary osteoarthritis of left shoulder 11/15/2019  . Traumatic complete tear of left rotator cuff 11/15/2019  . Closed fracture of spine of left scapula 11/15/2019  . Lumbosacral spondylosis without myelopathy 04/12/2014  . Lumbar radiculopathy 04/12/2014  . Herniated disc 01/24/2014  . Radicular leg pain 01/24/2014  . Lumbar spondylosis 02/15/2012  . Flexor tendon rupture of hand 07/08/2011  . Osteoarthritis of right knee 05/25/2011  . Gilbert's syndrome 05/17/2011  . Nonspecific abnormal electrocardiogram (ECG) (EKG) 05/17/2011  . History of hepatitis C 04/15/2009    Past Surgical History:  Procedure Laterality Date  .  COLONOSCOPY  2003  . PERCUTANEOUS LIVER BIOPSY  2006   Dr Arlyce Dice  . TONSILLECTOMY         Family History  Problem Relation Age of Onset  . Peripheral vascular disease Mother   . Alcohol abuse Father 50    Social History   Tobacco Use  . Smoking status: Never Smoker  . Smokeless tobacco: Never Used  Vaping Use  . Vaping Use: Never used  Substance Use Topics  . Alcohol use: Not Currently  . Drug use: Not Currently    Types: Cocaine    Comment: heroin    Home Medications Prior to Admission medications   Medication Sig Start Date End Date Taking? Authorizing Provider  aspirin 81 MG chewable tablet Chew by mouth. 08/28/19   [provider]  atorvastatin (LIPITOR) 20 MG tablet Take by mouth. 08/28/19   [provider]  cephALEXin (KEFLEX) 500 MG capsule Take 1 capsule (500 mg total) by mouth 4 (four) times daily. 01/16/20   Tanda Rockers, PA-C  cyanocobalamin 1000 MCG tablet Take by mouth. 08/28/19   [provider]  ipratropium (ATROVENT) 0.06 % nasal spray Place into the nose. 12/20/19 01/19/20  [provider]  Olopatadine HCl 0.2 % SOLN INT 1 GTT OU QD 10/23/18   [provider]  pantoprazole (PROTONIX) 40 MG tablet Take by mouth. 08/28/19   [provider]  polyethylene glycol powder (GLYCOLAX/MIRALAX) 17 GM/SCOOP powder Take 1-2 capfull daily 08/27/19  [provider]  sodium chloride (OCEAN) 0.65 % nasal spray Place into the nose. 12/20/19 01/19/20  [provider]    Allergies    Bupropion, Citalopram, and Lamotrigine  Review of Systems   Review of Systems  Constitutional: Negative for activity change.  Musculoskeletal: Positive for arthralgias and joint swelling. Negative for back pain and neck pain.  Skin: Negative for wound.  Neurological: Negative for weakness and numbness.    Physical Exam Updated Vital Signs BP 121/68 (BP Location: Right Arm)   Pulse 89   Temp 97.8 F (36.6 C)  (Oral)   Resp 16   Ht 5\' 9"  (1.753 m)   Wt 86.2 kg   SpO2 100%   BMI 28.06 kg/m   Physical Exam Vitals and nursing note reviewed.  Constitutional:      Appearance: He is well-developed and well-nourished.  HENT:     Head: Normocephalic and atraumatic.  Eyes:     Conjunctiva/sclera: Conjunctivae normal.  Cardiovascular:     Pulses: Normal pulses. No decreased pulses.  Musculoskeletal:        General: Tenderness present.     Cervical back: Normal range of motion and neck supple.     Right upper leg: No swelling or tenderness.     Left upper leg: No swelling or tenderness.     Right knee: Effusion (Mild) present. Decreased range of motion. Tenderness present.     Left knee: Effusion (Moderate) present. Decreased range of motion. Tenderness present.     Right lower leg: 1+ Pitting Edema present.     Left lower leg: 1+ Pitting Edema present.     Right ankle: Swelling present. Tenderness present.     Left ankle: Swelling present. Tenderness present.     Right foot: Normal range of motion.     Left foot: Normal range of motion.  Skin:    General: Skin is warm and dry.  Neurological:     Mental Status: He is alert.     Sensory: No sensory deficit.     Comments: Motor, sensation, and vascular distal to the injury is fully intact.   Psychiatric:        Mood and Affect: Mood and affect normal.     ED Results / Procedures / Treatments   Labs (all labs ordered are listed, but only abnormal results are displayed) Labs Reviewed - No data to display  EKG None  Radiology No results found.  Procedures Procedures (including critical care time)  Medications Ordered in ED Medications  ketorolac (TORADOL) injection 30 mg (has no administration in time range)    ED Course  I have reviewed the triage vital signs and the nursing notes.  Pertinent labs & imaging results that were available during my care of the patient were reviewed by me and considered in my medical decision  making (see chart for details).   Patient seen and examined.  Patient does not have signs of septic arthritis.  He has symmetric pain and swelling of the ankles and knees bilaterally.  Will start on anti-inflammatories.  Strongly encouraged follow-up with PCP or orthopedics.  Vital signs reviewed and are as follows: BP 121/68 (BP Location: Right Arm)   Pulse 89   Temp 97.8 F (36.6 C) (Oral)   Resp 16   Ht 5\' 9"  (1.753 m)   Wt 86.2 kg   SpO2 100%   BMI 28.06 kg/m   Discussed rice protocol.    MDM Rules/Calculators/A&P  Patient with bilateral knee effusions, ankle swelling.  Symptoms are bilateral.  Doubt DVT at this point.  No history of gout and I have lower suspicion for this at this point.  He has relatively preserved range of motion although pain with range of motion.  No constitutional symptoms to suggest septic arthritis.  He has better range of motion that I would expect if he had septic arthritis.  Plan for anti-inflammatories.  Recent cath with mild nonobstructive coronary artery disease.  Plan for close outpatient follow-up.   Final Clinical Impression(s) / ED Diagnoses Final diagnoses:  Bilateral knee effusions  Bilateral swelling of feet and ankles    Rx / DC Orders ED Discharge Orders         Ordered    meloxicam (MOBIC) 15 MG tablet  Daily        11/15/20 1807           Renne Crigler, PA-C 11/15/20 1808    Virgina Norfolk, DO 11/15/20 1812

## 2020-11-15 NOTE — ED Triage Notes (Signed)
Pt reports that bilateral knee/ankle pain with swelling and reports that "I can't walk". Pt reports that he was seen for the same in the past and had fluid removed. Pt reports started x 2 weeks ago

## 2021-08-08 DEATH — deceased

## 2023-02-24 ENCOUNTER — Encounter: Payer: Self-pay | Admitting: *Deleted
